# Patient Record
Sex: Female | Born: 1995
Health system: Southern US, Community
[De-identification: ages and names within clinical notes are randomized; demographics above are authoritative.]

## PROBLEM LIST (undated history)

## (undated) DIAGNOSIS — I350 Nonrheumatic aortic (valve) stenosis: Secondary | ICD-10-CM

## (undated) HISTORY — PX: CARDIAC SURGERY: SHX584

---
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
## 2017-09-13 ENCOUNTER — Other Ambulatory Visit: Payer: Self-pay

## 2017-09-13 ENCOUNTER — Emergency Department (HOSPITAL_BASED_OUTPATIENT_CLINIC_OR_DEPARTMENT_OTHER)
Admission: EM | Admit: 2017-09-13 | Discharge: 2017-09-13 | Disposition: A | Discharge status: Home / Self Care | Payer: Medicaid Other | Attending: Emergency Medicine | Admitting: Emergency Medicine

## 2017-09-13 ENCOUNTER — Emergency Department (HOSPITAL_BASED_OUTPATIENT_CLINIC_OR_DEPARTMENT_OTHER): Payer: Medicaid Other

## 2017-09-13 ENCOUNTER — Encounter (HOSPITAL_BASED_OUTPATIENT_CLINIC_OR_DEPARTMENT_OTHER): Payer: Self-pay

## 2017-09-13 DIAGNOSIS — Z87891 Personal history of nicotine dependence: Secondary | ICD-10-CM | POA: Insufficient documentation

## 2017-09-13 DIAGNOSIS — R05 Cough: Secondary | ICD-10-CM | POA: Insufficient documentation

## 2017-09-13 DIAGNOSIS — M791 Myalgia, unspecified site: Secondary | ICD-10-CM | POA: Insufficient documentation

## 2017-09-13 DIAGNOSIS — R6889 Other general symptoms and signs: Secondary | ICD-10-CM

## 2017-09-13 DIAGNOSIS — R509 Fever, unspecified: Secondary | ICD-10-CM | POA: Insufficient documentation

## 2017-09-13 MED ORDER — ACETAMINOPHEN 325 MG PO TABS
650.0000 mg | ORAL_TABLET | Freq: Once | ORAL | Status: AC
  Administered 2017-09-13: 650 mg via ORAL

## 2017-09-13 MED ORDER — ACETAMINOPHEN 500 MG PO TABS
1000.0000 mg | ORAL_TABLET | Freq: Once | ORAL | Status: AC
  Administered 2017-09-13: 1000 mg via ORAL
  Filled 2017-09-13: qty 2

## 2017-09-13 MED ORDER — OSELTAMIVIR PHOSPHATE 75 MG PO CAPS: 75.0000 mg | ORAL_CAPSULE | Freq: Two times a day (BID) | ORAL | 0 refills | Status: DC

## 2017-09-13 MED FILL — TAMIFLU 75 MG GELCAP: 75 | 5 days supply | Qty: 10 | Fill #0

## 2017-09-13 NOTE — ED Triage Notes (Signed)
C/o flu like sx day 2-NAD-steady gait 

## 2017-09-13 NOTE — ED Provider Notes (Signed)
[facility] EMERGENCY DEPARTMENT Provider Note   CSN: [id number] Arrival date & time: 09/13/17  1127     History   Chief Complaint Chief Complaint  Patient presents with  . Cough    HPI Melika Reder is a 22 y.o. female who presents with flulike symptoms.  Onset of symptoms 2 days ago.  She complains of myalgias, cough, URI symptoms, she has no known contacts with similar symptoms.  She is a fever today.  Patient states that she has not tried any medications prior to arrival.  She denies any wheezing, chest pain, shortness of breath.  HPI  History reviewed. No pertinent past medical history.  There are no active problems to display for this patient.   Past Surgical History:  Procedure Laterality Date  . CARDIAC SURGERY      OB History    No data available       Home Medications    Prior to Admission medications   Not on File    Family History No family history on file.  Social History Social History   Tobacco Use  . Smoking status: Former Games developer  . Smokeless tobacco: Never Used  Substance Use Topics  . Alcohol use: No  . Drug use: No     Allergies   Patient has no known allergies.   Review of Systems Review of Systems  Ten systems reviewed and are negative for acute change, except as noted in the HPI.   Physical Exam Updated Vital Signs BP 107/73 (BP Location: Right Arm)   Pulse (!) 118   Temp 100.2 F (37.9 C) (Oral)   Resp 18   Ht 5' (1.524 m)   Wt 61.7 kg (136 lb)   LMP 09/04/2017   SpO2 100%   BMI 26.56 kg/m   Physical Exam  Constitutional: She is oriented to person, place, and time. She appears well-developed and well-nourished. No distress.  HENT:  Head: Normocephalic and atraumatic.  Eyes: Conjunctivae are normal. No scleral icterus.  Neck: Normal range of motion.  Cardiovascular: Normal rate, regular rhythm and normal heart sounds. Exam reveals no gallop and no friction rub.  No murmur heard. Pulmonary/Chest:  Effort normal and breath sounds normal. No respiratory distress.  Abdominal: Soft. Bowel sounds are normal. She exhibits no distension and no mass. There is no tenderness. There is no guarding.  Neurological: She is alert and oriented to person, place, and time.  Skin: Skin is warm and dry. She is not diaphoretic.  Psychiatric: Her behavior is normal.  Nursing note and vitals reviewed.    ED Treatments / Results  Labs (all labs ordered are listed, but only abnormal results are displayed) Labs Reviewed - No data to display  EKG  EKG Interpretation None       Radiology Dg Chest 2 View  Result Date: 09/13/2017 CLINICAL DATA:  Cough, shortness of breath, and fever. EXAM: CHEST  2 VIEW COMPARISON:  None. FINDINGS: The heart size and mediastinal contours are within normal limits. Both lungs are clear. The visualized skeletal structures are unremarkable. IMPRESSION: Normal chest x-ray. Electronically Signed   By: Obie Dredge M.D.   On: 09/13/2017 13:49    Procedures Procedures (including critical care time)  Medications Ordered in ED Medications  acetaminophen (TYLENOL) tablet 1,000 mg (not administered)  acetaminophen (TYLENOL) tablet 650 mg (650 mg Oral Given 09/13/17 1149)     Initial Impression / Assessment and Plan / ED Course  I have reviewed the triage vital signs  and the nursing notes.  Pertinent labs & imaging results that were available during my care of the patient were reviewed by me and considered in my medical decision making (see chart for details).     Pt CXR negative for acute infiltrate. Patients symptoms are consistent with URI, likely viral etiology. Discussed that antibiotics are not indicated for viral infections. Pt will be discharged with symptomatic treatment.  Verbalizes understanding and is agreeable with plan. Pt is hemodynamically stable & in NAD prior to dc.   Final Clinical Impressions(s) / ED Diagnoses   Final diagnoses:  Flu-like symptoms      ED Discharge Orders    None       Arthor CaptainHarris, Ilyaas Musto, PA-C 09/13/17 1535    Doug SouJacubowitz, Sam, MD 09/13/17 1555

## 2017-09-13 NOTE — Discharge Instructions (Signed)
You appear to have an upper respiratory infection (URI). An upper respiratory tract infection, or cold, is a viral infection of the air passages leading to the lungs. It is contagious and can be spread to others, especially during the first 3 or 4 days. It cannot be cured by antibiotics or other medicines. °RETURN IMMEDIATELY IF you develop shortness of breath, confusion or altered mental status, a new rash, become dizzy, faint, or poorly responsive, or are unable to be cared for at home. ° °

## 2017-12-28 ENCOUNTER — Emergency Department (HOSPITAL_BASED_OUTPATIENT_CLINIC_OR_DEPARTMENT_OTHER)
Admission: EM | Admit: 2017-12-28 | Discharge: 2017-12-28 | Disposition: A | Discharge status: Home / Self Care | Payer: Medicaid Other | Attending: Emergency Medicine | Admitting: Emergency Medicine

## 2017-12-28 ENCOUNTER — Other Ambulatory Visit: Payer: Self-pay

## 2017-12-28 ENCOUNTER — Encounter (HOSPITAL_BASED_OUTPATIENT_CLINIC_OR_DEPARTMENT_OTHER): Payer: Self-pay

## 2017-12-28 DIAGNOSIS — B3731 Acute candidiasis of vulva and vagina: Secondary | ICD-10-CM

## 2017-12-28 DIAGNOSIS — Z87891 Personal history of nicotine dependence: Secondary | ICD-10-CM | POA: Insufficient documentation

## 2017-12-28 DIAGNOSIS — B373 Candidiasis of vulva and vagina: Secondary | ICD-10-CM | POA: Diagnosis not present

## 2017-12-28 DIAGNOSIS — N898 Other specified noninflammatory disorders of vagina: Secondary | ICD-10-CM | POA: Diagnosis present

## 2017-12-28 LAB — URINALYSIS, ROUTINE W REFLEX MICROSCOPIC
Bilirubin Urine: NEGATIVE
Glucose, UA: NEGATIVE mg/dL
Ketones, ur: NEGATIVE mg/dL
LEUKOCYTES UA: NEGATIVE
Nitrite: NEGATIVE
Protein, ur: NEGATIVE mg/dL
Specific Gravity, Urine: 1.025 (ref 1.005–1.030)
pH: 6.5 (ref 5.0–8.0)

## 2017-12-28 LAB — WET PREP, GENITAL
CLUE CELLS WET PREP: NONE SEEN
Sperm: NONE SEEN
Trich, Wet Prep: NONE SEEN
YEAST WET PREP: NONE SEEN

## 2017-12-28 LAB — URINALYSIS, MICROSCOPIC (REFLEX)

## 2017-12-28 LAB — PREGNANCY, URINE: Preg Test, Ur: NEGATIVE

## 2017-12-28 MED ORDER — FLUCONAZOLE 50 MG PO TABS
150.0000 mg | ORAL_TABLET | Freq: Once | ORAL | Status: AC
  Administered 2017-12-28: 150 mg via ORAL
  Filled 2017-12-28: qty 1

## 2017-12-28 NOTE — ED Provider Notes (Signed)
[facility] Provider Note: Breanna DellJ. Lane Jaquae Rieves, MD, FACEP  CSN: [id number] MRN: [id number] ARRIVAL: 12/28/17 at 0210 ROOM: MH01/MH01   CHIEF COMPLAINT  Groin Swelling   HISTORY OF PRESENT ILLNESS  12/28/17 2:48 AM Breanna Clark is a 22 y.o. female with vulvovaginal irritation.  She [facility] and was diagnosed with a yeast infection.  She was started on a 3 dose course of an unspecified medication.  She took her first dose yesterday.  The next dose is not due until tomorrow.  Despite taking her first dose of her symptoms of worsened and she is now having burning at rest severe enough to keep her from sleeping.  She denies a discharge.  She is currently on her menses.   History reviewed. No pertinent past medical history.  Past Surgical History:  Procedure Laterality Date  . CARDIAC SURGERY      No family history on file.  Social History   Tobacco Use  . Smoking status: Former Games developermoker  . Smokeless tobacco: Never Used  Substance Use Topics  . Alcohol use: No  . Drug use: No    Prior to Admission medications   Medication Sig Start Date End Date Taking? Authorizing Provider  oseltamivir (TAMIFLU) 75 MG capsule Take 1 capsule (75 mg total) by mouth every 12 (twelve) hours. 09/13/17  Yes Arthor CaptainHarris, Abigail, PA-C    Allergies Patient has no known allergies.   REVIEW OF SYSTEMS  Negative except as noted here or in the History of Present Illness.   PHYSICAL EXAMINATION  Initial Vital Signs Blood pressure (!) 131/93, pulse 90, temperature 98.6 F (37 C), temperature source Oral, resp. rate 18, height 5' (1.524 m), weight 63.5 kg (140 lb), last menstrual period 12/28/2017, SpO2 99 %.  Examination General: Well-developed, well-nourished female in no acute distress; appearance consistent with age of record HENT: normocephalic; atraumatic Eyes: pupils equal, round and reactive to light; extraocular muscles intact Neck: supple Heart:  regular rate and rhythm Lungs: clear to auscultation bilaterally Abdomen: soft; nondistended; nontender; bowel sounds present GU: Mild vulvovaginal inflammation without edema; no vaginal discharge; vaginal bleeding; no cervical motion tenderness Extremities: No deformity; full range of motion; pulses normal Neurologic: Awake, alert and oriented; motor function intact in all extremities and symmetric; no facial droop Skin: Warm and dry Psychiatric: Normal mood and affect   RESULTS  Summary of this visit's results, reviewed by myself:   EKG Interpretation  Date/Time:    Ventricular Rate:    PR Interval:    QRS Duration:   QT Interval:    QTC Calculation:   R Axis:     Text Interpretation:        Laboratory Studies: Results for orders placed or performed during the hospital encounter of 12/28/17 (from the past 24 hour(s))  Pregnancy, urine     Status: None   Collection Time: 12/28/17  2:22 AM  Result Value Ref Range   Preg Test, Ur NEGATIVE NEGATIVE  Urinalysis, Routine w reflex microscopic     Status: Abnormal   Collection Time: 12/28/17  2:22 AM  Result Value Ref Range   Color, Urine YELLOW YELLOW   APPearance CLEAR CLEAR   Specific Gravity, Urine 1.025 1.005 - 1.030   pH 6.5 5.0 - 8.0   Glucose, UA NEGATIVE NEGATIVE mg/dL   Hgb urine dipstick LARGE (A) NEGATIVE   Bilirubin Urine NEGATIVE NEGATIVE   Ketones, ur NEGATIVE NEGATIVE mg/dL   Protein, ur NEGATIVE NEGATIVE mg/dL  Nitrite NEGATIVE NEGATIVE   Leukocytes, UA NEGATIVE NEGATIVE  Urinalysis, Microscopic (reflex)     Status: Abnormal   Collection Time: 12/28/17  2:22 AM  Result Value Ref Range   RBC / HPF 0-5 0 - 5 RBC/hpf   WBC, UA 0-5 0 - 5 WBC/hpf   Bacteria, UA RARE (A) NONE SEEN   Squamous Epithelial / LPF 6-10 0 - 5  Wet prep, genital     Status: Abnormal   Collection Time: 12/28/17  3:02 AM  Result Value Ref Range   Yeast Wet Prep HPF POC NONE SEEN NONE SEEN   Trich, Wet Prep NONE SEEN NONE SEEN    Clue Cells Wet Prep HPF POC NONE SEEN NONE SEEN   WBC, Wet Prep HPF POC MODERATE (A) NONE SEEN   Sperm NONE SEEN    Imaging Studies: No results found.  ED COURSE and MDM  Nursing notes and initial vitals signs, including pulse oximetry, reviewed.  Vitals:   12/28/17 0217 12/28/17 0219  BP:  (!) 131/93  Pulse:  90  Resp:  18  Temp:  98.6 F (37 C)  TempSrc:  Oral  SpO2:  99%  Weight: 63.5 kg (140 lb)   Height: 5' (1.524 m)    We will treat with an additional dose of Diflucan.  Will avoid topical Monistat at this time due to her menses but she was advised that she may wish to try this if symptoms persist after the end of her period.  PROCEDURES    ED DIAGNOSES     ICD-10-CM   1. Vulvovaginitis due to yeast B37.3        Marshawn Normoyle, MD 12/28/17 [id number]

## 2017-12-28 NOTE — ED Triage Notes (Signed)
Pt seen at health dept on Monday dx with yeast infection. Still on antibiotic but feels it is getting worse. Pt also reports swelling.

## 2017-12-29 LAB — GC/CHLAMYDIA PROBE AMP (~~LOC~~) NOT AT ARMC
Chlamydia: NEGATIVE
NEISSERIA GONORRHEA: NEGATIVE

## 2018-01-28 ENCOUNTER — Other Ambulatory Visit: Payer: Self-pay

## 2018-01-28 ENCOUNTER — Emergency Department (HOSPITAL_BASED_OUTPATIENT_CLINIC_OR_DEPARTMENT_OTHER)
Admission: EM | Admit: 2018-01-28 | Discharge: 2018-01-28 | Disposition: A | Discharge status: Home / Self Care | Payer: Medicaid Other | Attending: Emergency Medicine | Admitting: Emergency Medicine

## 2018-01-28 ENCOUNTER — Encounter (HOSPITAL_BASED_OUTPATIENT_CLINIC_OR_DEPARTMENT_OTHER): Payer: Self-pay | Admitting: Emergency Medicine

## 2018-01-28 DIAGNOSIS — N898 Other specified noninflammatory disorders of vagina: Secondary | ICD-10-CM | POA: Diagnosis not present

## 2018-01-28 DIAGNOSIS — H9209 Otalgia, unspecified ear: Secondary | ICD-10-CM | POA: Diagnosis not present

## 2018-01-28 DIAGNOSIS — Z5321 Procedure and treatment not carried out due to patient leaving prior to being seen by health care provider: Secondary | ICD-10-CM | POA: Diagnosis not present

## 2018-01-28 NOTE — ED Triage Notes (Signed)
Patient states that her ear is has been bothering her for 4 days and she has had some itching to her vaginal area x 3 days

## 2018-01-29 ENCOUNTER — Other Ambulatory Visit: Payer: Self-pay

## 2018-01-29 ENCOUNTER — Encounter (HOSPITAL_BASED_OUTPATIENT_CLINIC_OR_DEPARTMENT_OTHER): Payer: Self-pay | Admitting: Emergency Medicine

## 2018-01-29 ENCOUNTER — Emergency Department (HOSPITAL_BASED_OUTPATIENT_CLINIC_OR_DEPARTMENT_OTHER)
Admission: EM | Admit: 2018-01-29 | Discharge: 2018-01-29 | Disposition: A | Discharge status: Home / Self Care | Payer: Medicaid Other | Attending: Emergency Medicine | Admitting: Emergency Medicine

## 2018-01-29 DIAGNOSIS — Z87891 Personal history of nicotine dependence: Secondary | ICD-10-CM | POA: Insufficient documentation

## 2018-01-29 DIAGNOSIS — Z3A01 Less than 8 weeks gestation of pregnancy: Secondary | ICD-10-CM | POA: Diagnosis not present

## 2018-01-29 DIAGNOSIS — B9689 Other specified bacterial agents as the cause of diseases classified elsewhere: Secondary | ICD-10-CM | POA: Diagnosis not present

## 2018-01-29 DIAGNOSIS — N898 Other specified noninflammatory disorders of vagina: Secondary | ICD-10-CM | POA: Diagnosis present

## 2018-01-29 DIAGNOSIS — N760 Acute vaginitis: Secondary | ICD-10-CM | POA: Insufficient documentation

## 2018-01-29 LAB — URINALYSIS, ROUTINE W REFLEX MICROSCOPIC
BILIRUBIN URINE: NEGATIVE
Glucose, UA: NEGATIVE mg/dL
KETONES UR: NEGATIVE mg/dL
Nitrite: NEGATIVE
PROTEIN: NEGATIVE mg/dL
pH: 6 (ref 5.0–8.0)

## 2018-01-29 LAB — WET PREP, GENITAL
SPERM: NONE SEEN
Trich, Wet Prep: NONE SEEN

## 2018-01-29 LAB — URINALYSIS, MICROSCOPIC (REFLEX)

## 2018-01-29 LAB — PREGNANCY, URINE: Preg Test, Ur: POSITIVE — AB

## 2018-01-29 MED ORDER — LIDOCAINE HCL (PF) 1 % IJ SOLN
INTRAMUSCULAR | Status: AC
  Administered 2018-01-29: 2.3 mL
  Filled 2018-01-29: qty 5

## 2018-01-29 MED ORDER — CLOTRIMAZOLE 1 % VA CREA: 1.0000 | TOPICAL_CREAM | Freq: Every day | VAGINAL | 0 refills | Status: DC

## 2018-01-29 MED ORDER — CEFTRIAXONE SODIUM 250 MG IJ SOLR
250.0000 mg | Freq: Once | INTRAMUSCULAR | Status: AC
  Administered 2018-01-29: 250 mg via INTRAMUSCULAR
  Filled 2018-01-29: qty 250

## 2018-01-29 MED ORDER — METRONIDAZOLE 500 MG PO TABS: 500.0000 mg | ORAL_TABLET | Freq: Two times a day (BID) | ORAL | 0 refills | Status: DC

## 2018-01-29 MED ORDER — AZITHROMYCIN 250 MG PO TABS
1000.0000 mg | ORAL_TABLET | Freq: Once | ORAL | Status: AC
  Administered 2018-01-29: 1000 mg via ORAL
  Filled 2018-01-29: qty 4

## 2018-01-29 NOTE — ED Notes (Signed)
Pelvic cart at room 

## 2018-01-29 NOTE — ED Triage Notes (Signed)
Left ear pain since last Wednesday, some sore throat and nasal drainage.  Vaginal discharge with burning and itching since Friday.

## 2018-01-29 NOTE — Discharge Instructions (Signed)
You need to establish care as soon as possible with an OB/GYN.  You should start taking a prenatal vitamin which she can obtain over-the-counter.  Return to the emergency room for any worsening symptoms.

## 2018-01-29 NOTE — ED Provider Notes (Signed)
[facility] EMERGENCY DEPARTMENT Provider Note   CSN: [id number] Arrival date & time: 01/29/18  0734     History   Chief Complaint Chief Complaint  Patient presents with  . Otalgia  . Vaginal Discharge    HPI Breanna Clark is a 22 y.o. female.  Patient is a 22 year old female who has 2 complaints.  She does have some ear congestion.  She has had URI symptoms for about 2 weeks.  She still has a little bit of a runny nose.  No fevers.  No significant headaches.  She feels like her ears are sore and congested.  No dizziness.  She was taking some Mucinex but she did not feel like it was really helping so she has not been taking anything recently.  She also complains of vaginal itching and discharge.  This is been going on for about 2 days.  No abdominal pain.  No nausea or vomiting.  No fevers.  No urinary symptoms.  She does have a prior history of gonorrhea and chlamydia.  She is sexually active.  Last menstrual period was April 30.     No past medical history on file.  There are no active problems to display for this patient.   Past Surgical History:  Procedure Laterality Date  . CARDIAC SURGERY       OB History   None      Home Medications    Prior to Admission medications   Medication Sig Start Date End Date Taking? Authorizing Provider  clotrimazole (GYNE-LOTRIMIN) 1 % vaginal cream Place 1 Applicatorful vaginally at bedtime. For 7 days 01/29/18   Rolan Bucco, MD  metroNIDAZOLE (FLAGYL) 500 MG tablet Take 1 tablet (500 mg total) by mouth 2 (two) times daily. One po bid x 7 days 01/29/18   Rolan Bucco, MD    Family History No family history on file.  Social History Social History   Tobacco Use  . Smoking status: Former Games developer  . Smokeless tobacco: Never Used  Substance Use Topics  . Alcohol use: No  . Drug use: No     Allergies   Patient has no known allergies.   Review of Systems Review of Systems  Constitutional: Negative for  chills, diaphoresis, fatigue and fever.  HENT: Positive for congestion, ear pain and rhinorrhea. Negative for sneezing.   Eyes: Negative.   Respiratory: Negative for cough, chest tightness and shortness of breath.   Cardiovascular: Negative for chest pain and leg swelling.  Gastrointestinal: Negative for abdominal pain, blood in stool, diarrhea, nausea and vomiting.  Genitourinary: Positive for vaginal discharge. Negative for difficulty urinating, flank pain, frequency and hematuria.  Musculoskeletal: Negative for arthralgias and back pain.  Skin: Negative for rash.  Neurological: Negative for dizziness, speech difficulty, weakness, numbness and headaches.     Physical Exam Updated Vital Signs BP 121/85   Pulse 100   Temp 99.3 F (37.4 C) (Oral)   Resp 16   Ht 5' (1.524 m)   Wt 63.5 kg (140 lb)   LMP 01/02/2018   SpO2 100%   BMI 27.34 kg/m   Physical Exam  Genitourinary:  Genitourinary Comments: Large amount of thick yellow discharge.  No cervical motion tenderness or adnexal tenderness, no bleeding     ED Treatments / Results  Labs (all labs ordered are listed, but only abnormal results are displayed) Labs Reviewed  WET PREP, GENITAL - Abnormal; Notable for the following components:      Result Value   Yeast  Wet Prep HPF POC PRESENT (*)    Clue Cells Wet Prep HPF POC PRESENT (*)    WBC, Wet Prep HPF POC MANY (*)    All other components within normal limits  URINALYSIS, ROUTINE W REFLEX MICROSCOPIC - Abnormal; Notable for the following components:   APPearance CLOUDY (*)    Specific Gravity, Urine >1.030 (*)    Hgb urine dipstick TRACE (*)    Leukocytes, UA MODERATE (*)    All other components within normal limits  PREGNANCY, URINE - Abnormal; Notable for the following components:   Preg Test, Ur POSITIVE (*)    All other components within normal limits  URINALYSIS, MICROSCOPIC (REFLEX) - Abnormal; Notable for the following components:   Bacteria, UA MANY (*)     All other components within normal limits  RPR  HIV ANTIBODY (ROUTINE TESTING)  GC/CHLAMYDIA PROBE AMP ([place]) NOT AT [facility]    EKG None  Radiology No results found.  Procedures Procedures (including critical care time)  Medications Ordered in ED Medications  cefTRIAXone (ROCEPHIN) injection 250 mg (250 mg Intramuscular Given 01/29/18 0822)  azithromycin (ZITHROMAX) tablet 1,000 mg (1,000 mg Oral Given 01/29/18 0820)  lidocaine (PF) (XYLOCAINE) 1 % injection (2.3 mLs  Given 01/29/18 1610)     Initial Impression / Assessment and Plan / ED Course  I have reviewed the triage vital signs and the nursing notes.  Pertinent labs & imaging results that were available during my care of the patient were reviewed by me and considered in my medical decision making (see chart for details).     Patient is a 22 year old female who presents with vaginal discharge and ear pain.  Her ears do not appear infected.  She has clear fluid behind both TMs.  I recommended using plain Zyrtec for symptomatic relief.  She also has vaginal discharge.  She has a prior history of STDs.  She has large amount of yellow discharge.  Her pregnancy test is positive.  Given her high risk status, I did go ahead and treat her presumptively for GC and chlamydia with Rocephin and Zithromax.  She also tested positive for yeast and BV.  She was given a prescription for Chlortrimazole suppositories as well as Flagyl for the bacterial vaginosis.  She was encouraged to start taking a prenatal vitamin which she can obtain over-the-counter.  She also was encouraged to establish care with an OB/GYN to get early prenatal care.  Return precautions were given.  Final Clinical Impressions(s) / ED Diagnoses   Final diagnoses:  Less than [redacted] weeks gestation of pregnancy  BV (bacterial vaginosis)  Acute vaginitis    ED Discharge Orders        Ordered    clotrimazole (GYNE-LOTRIMIN) 1 % vaginal cream  Daily at bedtime     01/29/18  0836    metroNIDAZOLE (FLAGYL) 500 MG tablet  2 times daily     01/29/18 0836       Rolan Bucco, MD 01/29/18 [phone]

## 2018-01-30 LAB — RPR: RPR: NONREACTIVE

## 2018-01-30 LAB — GC/CHLAMYDIA PROBE AMP (~~LOC~~) NOT AT ARMC
CHLAMYDIA, DNA PROBE: NEGATIVE
Neisseria Gonorrhea: NEGATIVE

## 2018-01-31 LAB — HIV ANTIBODY (ROUTINE TESTING W REFLEX): HIV SCREEN 4TH GENERATION: NONREACTIVE

## 2018-03-11 ENCOUNTER — Encounter (HOSPITAL_BASED_OUTPATIENT_CLINIC_OR_DEPARTMENT_OTHER): Payer: Self-pay | Admitting: Emergency Medicine

## 2018-03-11 ENCOUNTER — Other Ambulatory Visit: Payer: Self-pay

## 2018-03-11 ENCOUNTER — Other Ambulatory Visit: Payer: Self-pay | Admitting: Emergency Medicine

## 2018-03-11 ENCOUNTER — Emergency Department (HOSPITAL_BASED_OUTPATIENT_CLINIC_OR_DEPARTMENT_OTHER)
Admission: EM | Admit: 2018-03-11 | Discharge: 2018-03-11 | Disposition: A | Discharge status: Home / Self Care | Payer: Medicaid Other | Attending: Emergency Medicine | Admitting: Emergency Medicine

## 2018-03-11 DIAGNOSIS — Z87891 Personal history of nicotine dependence: Secondary | ICD-10-CM | POA: Insufficient documentation

## 2018-03-11 DIAGNOSIS — N3001 Acute cystitis with hematuria: Secondary | ICD-10-CM | POA: Diagnosis not present

## 2018-03-11 DIAGNOSIS — N898 Other specified noninflammatory disorders of vagina: Secondary | ICD-10-CM | POA: Insufficient documentation

## 2018-03-11 LAB — PREGNANCY, URINE: Preg Test, Ur: POSITIVE — AB

## 2018-03-11 LAB — URINALYSIS, ROUTINE W REFLEX MICROSCOPIC
BILIRUBIN URINE: NEGATIVE
Glucose, UA: NEGATIVE mg/dL
KETONES UR: NEGATIVE mg/dL
NITRITE: NEGATIVE
PH: 5.5 (ref 5.0–8.0)
Protein, ur: NEGATIVE mg/dL
Specific Gravity, Urine: >1.03 — ABNORMAL HIGH (ref 1.005–1.030)

## 2018-03-11 LAB — WET PREP, GENITAL
Clue Cells Wet Prep HPF POC: NONE SEEN
Sperm: NONE SEEN
TRICH WET PREP: NONE SEEN
Yeast Wet Prep HPF POC: NONE SEEN

## 2018-03-11 MED ORDER — CEPHALEXIN 500 MG PO CAPS: 500.0000 mg | ORAL_CAPSULE | Freq: Two times a day (BID) | ORAL | 0 refills | Status: DC

## 2018-03-11 NOTE — ED Notes (Signed)
Pt states she is no longer pregnant. Pt had an abortion procedure on 7/1.

## 2018-03-11 NOTE — ED Notes (Signed)
Pt given d/c instructions as per chart. rx x 1. Verbalizes understanding. No questions. 

## 2018-03-11 NOTE — ED Provider Notes (Signed)
[facility] EMERGENCY DEPARTMENT Provider Note   CSN: [id number] Arrival date & time: 03/11/18  1451     History   Chief Complaint Chief Complaint  Patient presents with  . Vaginal Discharge    HPI Breanna Clark is a 22 y.o. female who presents emergency department today for vaginal itching/discharge.  Patient was recently pregnant and reports that she underwent an abortion by procedure on Monday at [facility] choice of [place].  Patient's last menstrual cycle prior to this was April 30.  She has not had any abdominal pain or vaginal bleeding since then. Patient reports that she was seen here on 5/27 for vaginal itching as well as vaginal discharge.  She was diagnosed with bacterial vaginosis and acute vaginitis.  She reports that she took the antibiotics but was not able to fill the cream (clotrimazole).  She has had continued vaginal itching as well as a clear/yellow discharge. She denies urinary symptoms, abdominal pain, syncope or vaginal bleeding. She is sexual active with female partners. She denies fevers, pelvic pain, N/V, flank pain, or urinary symptoms. She does have history of G/C in the past (most recent cultures were negative). Was on prophylactic ceftriaxone and azithromycin during last visit.  She has not been sexually active since that time.  HPI  History reviewed. No pertinent past medical history.  There are no active problems to display for this patient.   Past Surgical History:  Procedure Laterality Date  . CARDIAC SURGERY       OB History   None      Home Medications    Prior to Admission medications   Medication Sig Start Date End Date Taking? Authorizing Provider  clotrimazole (GYNE-LOTRIMIN) 1 % vaginal cream Place 1 Applicatorful vaginally at bedtime. For 7 days 01/29/18   Rolan Bucco, MD  metroNIDAZOLE (FLAGYL) 500 MG tablet Take 1 tablet (500 mg total) by mouth 2 (two) times daily. One po bid x 7 days 01/29/18   Rolan Bucco, MD     Family History History reviewed. No pertinent family history.  Social History Social History   Tobacco Use  . Smoking status: Former Games developer  . Smokeless tobacco: Never Used  Substance Use Topics  . Alcohol use: No  . Drug use: No     Allergies   Patient has no known allergies.   Review of Systems Review of Systems  All other systems reviewed and are negative.    Physical Exam Updated Vital Signs BP 120/75 (BP Location: Left Arm)   Pulse 92   Temp 98.5 F (36.9 C) (Oral)   Resp 18   Ht 5' (1.524 m)   Wt 68 kg (150 lb)   LMP 03/05/2018   SpO2 100%   BMI 29.29 kg/m   Physical Exam  Constitutional: She appears well-developed and well-nourished.  HENT:  Head: Normocephalic and atraumatic.  Right Ear: External ear normal.  Left Ear: External ear normal.  Nose: Nose normal.  Mouth/Throat: Uvula is midline, oropharynx is clear and moist and mucous membranes are normal. No tonsillar exudate.  Eyes: Pupils are equal, round, and reactive to light. Right eye exhibits no discharge. Left eye exhibits no discharge. No scleral icterus.  Neck: Trachea normal. Neck supple. No spinous process tenderness present. No neck rigidity. Normal range of motion present.  Cardiovascular: Normal rate, regular rhythm and intact distal pulses.  No murmur heard. Pulses:      Radial pulses are 2+ on the right side, and 2+ on the left side.  Dorsalis pedis pulses are 2+ on the right side, and 2+ on the left side.       Posterior tibial pulses are 2+ on the right side, and 2+ on the left side.  No lower extremity swelling or edema. Calves symmetric in size bilaterally.  Pulmonary/Chest: Effort normal and breath sounds normal. She exhibits no tenderness.  Abdominal: Soft. Bowel sounds are normal. She exhibits no distension. There is no tenderness. There is no rigidity, no rebound, no guarding and no CVA tenderness.  Genitourinary:  Genitourinary Comments: Exam performed by Jacinto HalimMichael M  Clatie Kessen, exam chaperoned Pelvic exam: normal external genitalia without evidence of trauma. VULVA: normal appearing vulva with no masses, tenderness or lesion. VAGINA: normal appearing vagina with normal color and discharge, no lesions. CERVIX: normal appearing cervix without lesions, cervical motion tenderness absent, cervical os closed with out purulent discharge; vaginal discharge - white and scant, Wet prep and DNA probe for chlamydia and GC obtained.   ADNEXA: normal adnexa in size, nontender and no masses UTERUS: uterus is normal size, shape, consistency and nontender.   Musculoskeletal: She exhibits no edema.  Lymphadenopathy:    She has no cervical adenopathy.  Neurological: She is alert.  Skin: Skin is warm and dry. No rash noted. She is not diaphoretic.  Psychiatric: She has a normal mood and affect.  Nursing note and vitals reviewed.    ED Treatments / Results  Labs (all labs ordered are listed, but only abnormal results are displayed) Labs Reviewed  WET PREP, GENITAL - Abnormal; Notable for the following components:      Result Value   WBC, Wet Prep HPF POC MANY (*)    All other components within normal limits  PREGNANCY, URINE - Abnormal; Notable for the following components:   Preg Test, Ur POSITIVE (*)    All other components within normal limits  URINALYSIS, ROUTINE W REFLEX MICROSCOPIC - Abnormal; Notable for the following components:   APPearance CLOUDY (*)    Specific Gravity, Urine >1.030 (*)    Hgb urine dipstick SMALL (*)    Leukocytes, UA MODERATE (*)    All other components within normal limits  URINE CULTURE  RPR  HIV ANTIBODY (ROUTINE TESTING)  GC/CHLAMYDIA PROBE AMP ([place]) NOT AT [facility]    EKG None  Radiology No results found.  Procedures Procedures (including critical care time)  Medications Ordered in ED Medications - No data to display   Initial Impression / Assessment and Plan / ED Course  I have reviewed the triage vital signs  and the nursing notes.  Pertinent labs & imaging results that were available during my care of the patient were reviewed by me and considered in my medical decision making (see chart for details).     22 y.o. female who [facility] [place] presenting with vaginal discharge and vaginal irritation.  Vital signs are reassuring on presentation.  Patient without any abdominal pain or vaginal bleeding.  Exam is not consistent with PID.  Patient's last gonorrhea and Chlamydia cultures were negative  Patient's urine pregnancy test is positive as expected.  There is no evidence of yeast infection, trichomonas or BV.  Gonorrhea/Chlamydia cultures are pending.  Patient does have moderate leukocytes.  Likely secondary to UTI as patient gonorrhea and Chlamydia were negative previously and she is not been sexually active since that time.  Patient without history of kidney stones.  No CVA tenderness, nausea, vomiting or fever to  make me concern for pyelonephritis..  Will treat with Keflex and send urine culture. I advised the patient to follow-up with PCP this week. Specific return precautions discussed. Time was given for all questions to be answered. The patient verbalized understanding and agreement with plan. The patient appears safe for discharge home.  Final Clinical Impressions(s) / ED Diagnoses   Final diagnoses:  Acute cystitis with hematuria    ED Discharge Orders        Ordered    cephALEXin (KEFLEX) 500 MG capsule  2 times daily     03/11/18 1936       Princella Pellegrini 03/11/18 2020    Arby Barrette, MD 03/13/18 1150

## 2018-03-11 NOTE — ED Triage Notes (Signed)
Patient states that she was here about a month ago for BV - she took the medications and states that she continues to have the d/c

## 2018-03-12 LAB — GC/CHLAMYDIA PROBE AMP (~~LOC~~) NOT AT ARMC
Chlamydia: NEGATIVE
NEISSERIA GONORRHEA: NEGATIVE

## 2018-03-13 LAB — URINE CULTURE

## 2018-03-13 LAB — HIV ANTIBODY (ROUTINE TESTING W REFLEX): HIV SCREEN 4TH GENERATION: NONREACTIVE

## 2018-03-13 LAB — RPR: RPR: NONREACTIVE

## 2018-04-04 ENCOUNTER — Encounter (HOSPITAL_BASED_OUTPATIENT_CLINIC_OR_DEPARTMENT_OTHER): Payer: Self-pay | Admitting: Emergency Medicine

## 2018-04-04 ENCOUNTER — Other Ambulatory Visit: Payer: Self-pay

## 2018-04-04 ENCOUNTER — Emergency Department (HOSPITAL_BASED_OUTPATIENT_CLINIC_OR_DEPARTMENT_OTHER)
Admission: EM | Admit: 2018-04-04 | Discharge: 2018-04-04 | Disposition: A | Discharge status: Home / Self Care | Payer: Medicaid Other | Attending: Emergency Medicine | Admitting: Emergency Medicine

## 2018-04-04 DIAGNOSIS — R21 Rash and other nonspecific skin eruption: Secondary | ICD-10-CM | POA: Diagnosis not present

## 2018-04-04 DIAGNOSIS — Z87891 Personal history of nicotine dependence: Secondary | ICD-10-CM | POA: Diagnosis not present

## 2018-04-04 NOTE — ED Triage Notes (Signed)
Reports rash to bilateral arms and legs x 1 week.  Reports this as pruritic.

## 2018-04-04 NOTE — Discharge Instructions (Addendum)
If your rash worsens or he develop fever, streaking redness, pain, trouble breathing or swallowing, or any other new/concerning symptoms and return to the ER for evaluation.

## 2018-04-04 NOTE — ED Provider Notes (Signed)
[facility] EMERGENCY DEPARTMENT Provider Note   CSN: [id number] Arrival date & time: 04/04/18  1045     History   Chief Complaint Chief Complaint  Patient presents with  . Rash    HPI Breanna Clark is a 22 y.o. female.  HPI  22 year old female presents with rash.  Started 1 week ago.  Started as a small spot on 1 of her arms and now has progressed.  They are itchy.  They are not rapidly progressing.  No new detergents, medicines, animals, food, etc.  No trouble breathing or swallowing or any facial swelling.  Has tried Benadryl cream and hydrocortisone cream.  History reviewed. No pertinent past medical history.  There are no active problems to display for this patient.   Past Surgical History:  Procedure Laterality Date  . CARDIAC SURGERY       OB History   None      Home Medications    Prior to Admission medications   Medication Sig Start Date End Date Taking? Authorizing Provider  cephALEXin (KEFLEX) 500 MG capsule Take 1 capsule (500 mg total) by mouth 2 (two) times daily. 03/11/18   Maczis, Elmer Sow, PA-C  clotrimazole (GYNE-LOTRIMIN) 1 % vaginal cream Place 1 Applicatorful vaginally at bedtime. For 7 days 01/29/18   Rolan Bucco, MD  metroNIDAZOLE (FLAGYL) 500 MG tablet Take 1 tablet (500 mg total) by mouth 2 (two) times daily. One po bid x 7 days 01/29/18   Rolan Bucco, MD    Family History History reviewed. No pertinent family history.  Social History Social History   Tobacco Use  . Smoking status: Former Games developer  . Smokeless tobacco: Never Used  Substance Use Topics  . Alcohol use: No  . Drug use: No     Allergies   Patient has no known allergies.   Review of Systems Review of Systems  Constitutional: Negative for fever.  Respiratory: Negative for shortness of breath.   Skin: Positive for rash.     Physical Exam Updated Vital Signs BP 122/82 (BP Location: Left Arm)   Pulse 80   Temp 98.8 F (37.1 C) (Oral)   Resp  18   Ht 5' (1.524 m)   Wt 63.5 kg (140 lb)   LMP 03/05/2018   SpO2 100%   BMI 27.34 kg/m   Physical Exam  Constitutional: She is oriented to person, place, and time. She appears well-developed and well-nourished.  HENT:  Head: Normocephalic and atraumatic.  Right Ear: External ear normal.  Left Ear: External ear normal.  Nose: Nose normal.  Mouth/Throat: Oropharynx is clear and moist. No oropharyngeal exudate.  Eyes: Right eye exhibits no discharge. Left eye exhibits no discharge.  Neck: Neck supple.  Cardiovascular: Normal rate, regular rhythm and normal heart sounds.  Pulmonary/Chest: Effort normal and breath sounds normal. No stridor. She has no wheezes.  Abdominal: She exhibits no distension.  Neurological: She is alert and oriented to person, place, and time.  Skin: Skin is warm and dry. Rash noted.  Patient has infrequent small papules, mostly to the bilateral arms.  They are about 3 on each arm.  A couple have a red base but there are no vesicular lesions.  There is no streaking or cellulitis.  There is one lesion to her right flank and a couple on her right thigh.  Nursing note and vitals reviewed.    ED Treatments / Results  Labs (all labs ordered are listed, but only abnormal results are displayed) Labs Reviewed -  No data to display  EKG None  Radiology No results found.  Procedures Procedures (including critical care time)  Medications Ordered in ED Medications - No data to display   Initial Impression / Assessment and Plan / ED Course  I have reviewed the triage vital signs and the nursing notes.  Pertinent labs & imaging results that were available during my care of the patient were reviewed by me and considered in my medical decision making (see chart for details).     Unclear what exactly is causing her papular rash but this does not appear emergent or critical at this time.  There is no fevers or oral lesions.  Does not appear like a drug rash.  At  this point I think she can use oral Benadryl in addition to her symptoms and follow-up with PCP and/or dermatologist.  Final Clinical Impressions(s) / ED Diagnoses   Final diagnoses:  Rash and nonspecific skin eruption    ED Discharge Orders    None       Pricilla LovelessGoldston, Patton Rabinovich, MD 04/04/18 1219

## 2018-07-30 ENCOUNTER — Emergency Department (HOSPITAL_BASED_OUTPATIENT_CLINIC_OR_DEPARTMENT_OTHER)
Admission: EM | Admit: 2018-07-30 | Discharge: 2018-07-30 | Disposition: A | Discharge status: Home / Self Care | Payer: Medicaid Other | Attending: Emergency Medicine | Admitting: Emergency Medicine

## 2018-07-30 ENCOUNTER — Encounter (HOSPITAL_BASED_OUTPATIENT_CLINIC_OR_DEPARTMENT_OTHER): Payer: Self-pay | Admitting: *Deleted

## 2018-07-30 ENCOUNTER — Other Ambulatory Visit: Payer: Self-pay

## 2018-07-30 DIAGNOSIS — Z87891 Personal history of nicotine dependence: Secondary | ICD-10-CM | POA: Diagnosis not present

## 2018-07-30 DIAGNOSIS — N72 Inflammatory disease of cervix uteri: Secondary | ICD-10-CM | POA: Insufficient documentation

## 2018-07-30 DIAGNOSIS — N898 Other specified noninflammatory disorders of vagina: Secondary | ICD-10-CM | POA: Diagnosis present

## 2018-07-30 LAB — WET PREP, GENITAL
CLUE CELLS WET PREP: NONE SEEN
Sperm: NONE SEEN
Trich, Wet Prep: NONE SEEN
Yeast Wet Prep HPF POC: NONE SEEN

## 2018-07-30 LAB — URINALYSIS, ROUTINE W REFLEX MICROSCOPIC
Bilirubin Urine: NEGATIVE
GLUCOSE, UA: NEGATIVE mg/dL
HGB URINE DIPSTICK: NEGATIVE
Ketones, ur: NEGATIVE mg/dL
Leukocytes, UA: NEGATIVE
Nitrite: NEGATIVE
Protein, ur: NEGATIVE mg/dL
Specific Gravity, Urine: 1.025 (ref 1.005–1.030)
pH: 6.5 (ref 5.0–8.0)

## 2018-07-30 LAB — PREGNANCY, URINE: PREG TEST UR: NEGATIVE

## 2018-07-30 MED ORDER — AZITHROMYCIN 250 MG PO TABS
500.0000 mg | ORAL_TABLET | Freq: Once | ORAL | Status: AC
  Administered 2018-07-30: 500 mg via ORAL
  Filled 2018-07-30: qty 2

## 2018-07-30 MED ORDER — LIDOCAINE HCL (PF) 1 % IJ SOLN
2.0000 mL | Freq: Once | INTRAMUSCULAR | Status: AC
  Administered 2018-07-30: 2 mL via INTRADERMAL
  Filled 2018-07-30: qty 5

## 2018-07-30 MED ORDER — CEFTRIAXONE SODIUM 250 MG IJ SOLR
250.0000 mg | Freq: Once | INTRAMUSCULAR | Status: AC
  Administered 2018-07-30: 250 mg via INTRAMUSCULAR
  Filled 2018-07-30: qty 250

## 2018-07-30 NOTE — ED Triage Notes (Signed)
Pt has been having vaginal discharge and itching for a week.

## 2018-07-30 NOTE — Discharge Instructions (Signed)
Use condoms when having sex. °

## 2018-07-30 NOTE — ED Provider Notes (Signed)
[facility] EMERGENCY DEPARTMENT Provider Note   CSN: [id number] Arrival date & time: 07/30/18  7829     History   Chief Complaint Chief Complaint  Patient presents with  . Vaginal Itching    HPI Breanna Clark is a 22 y.o. female.  Pt presents to the ED today with vaginal discharge and itching.  She has noticed sx for the past week.  She has had unprotected sex.  She does not know if she's been exposed to STI or not.  No other sx.     History reviewed. No pertinent past medical history.  There are no active problems to display for this patient.   Past Surgical History:  Procedure Laterality Date  . CARDIAC SURGERY       OB History   None      Home Medications    Prior to Admission medications   Medication Sig Start Date End Date Taking? Authorizing Provider  cephALEXin (KEFLEX) 500 MG capsule Take 1 capsule (500 mg total) by mouth 2 (two) times daily. 03/11/18   Maczis, Elmer Sow, PA-C  clotrimazole (GYNE-LOTRIMIN) 1 % vaginal cream Place 1 Applicatorful vaginally at bedtime. For 7 days 01/29/18   Rolan Bucco, MD  metroNIDAZOLE (FLAGYL) 500 MG tablet Take 1 tablet (500 mg total) by mouth 2 (two) times daily. One po bid x 7 days 01/29/18   Rolan Bucco, MD    Family History History reviewed. No pertinent family history.  Social History Social History   Tobacco Use  . Smoking status: Former Games developer  . Smokeless tobacco: Never Used  Substance Use Topics  . Alcohol use: No  . Drug use: No     Allergies   Patient has no known allergies.   Review of Systems Review of Systems  Genitourinary: Positive for vaginal discharge.  All other systems reviewed and are negative.    Physical Exam Updated Vital Signs BP 113/71 (BP Location: Left Arm)   Pulse 80   Temp 98.6 F (37 C) (Oral)   Resp 20   Ht 5' (1.524 m)   Wt 61.7 kg   LMP 07/21/2018 (Exact Date)   SpO2 100%   BMI 26.56 kg/m   Physical Exam  Constitutional: She is  oriented to person, place, and time. She appears well-developed and well-nourished.  HENT:  Head: Normocephalic and atraumatic.  Right Ear: External ear normal.  Left Ear: External ear normal.  Nose: Nose normal.  Mouth/Throat: Oropharynx is clear and moist.  Eyes: Pupils are equal, round, and reactive to light. Conjunctivae and EOM are normal.  Neck: Normal range of motion. Neck supple.  Cardiovascular: Normal rate, regular rhythm, normal heart sounds and intact distal pulses.  Pulmonary/Chest: Effort normal and breath sounds normal.  Abdominal: Soft. Bowel sounds are normal.  Genitourinary: Vagina normal. Cervix exhibits discharge. Right adnexum displays no tenderness. Left adnexum displays no tenderness.  Musculoskeletal: Normal range of motion.  Neurological: She is alert and oriented to person, place, and time.  Skin: Skin is warm. Capillary refill takes less than 2 seconds.  Psychiatric: She has a normal mood and affect. Her behavior is normal. Judgment and thought content normal.  Nursing note and vitals reviewed.    ED Treatments / Results  Labs (all labs ordered are listed, but only abnormal results are displayed) Labs Reviewed  WET PREP, GENITAL - Abnormal; Notable for the following components:      Result Value   WBC, Wet Prep HPF POC MODERATE (*)  All other components within normal limits  URINALYSIS, ROUTINE W REFLEX MICROSCOPIC  PREGNANCY, URINE  GC/CHLAMYDIA PROBE AMP ([place]) NOT [facility]    EKG None  Radiology No results found.  Procedures Procedures (including critical care time)  Medications Ordered in ED Medications  cefTRIAXone (ROCEPHIN) injection 250 mg (250 mg Intramuscular Given 07/30/18 1007)  azithromycin (ZITHROMAX) tablet 500 mg (500 mg Oral Given 07/30/18 1007)  lidocaine (PF) (XYLOCAINE) 1 % injection 2 mL (2 mLs Intradermal Given 07/30/18 1006)     Initial Impression / Assessment and Plan / ED Course  I have reviewed the triage  vital signs and the nursing notes.  Pertinent labs & imaging results that were available during my care of the patient were reviewed by me and considered in my medical decision making (see chart for details).    Pt requested treatment for gonorrhea and chlamydia, so she was given a dose of rocephin and zithromax.  Wet prep did not reveal yeast or bv.  Use condoms when having sex.  Pt instructed to f/u with women's clinic.  Return if worse.  Final Clinical Impressions(s) / ED Diagnoses   Final diagnoses:  Cervicitis    ED Discharge Orders    None       Jacalyn LefevreHaviland, Angelisa Winthrop, MD 07/30/18 1051

## 2018-07-30 NOTE — ED Notes (Signed)
NAD at this time. Pt is stable and going home.  

## 2018-07-31 LAB — GC/CHLAMYDIA PROBE AMP (~~LOC~~) NOT AT ARMC
Chlamydia: NEGATIVE
Neisseria Gonorrhea: NEGATIVE

## 2018-08-06 ENCOUNTER — Encounter (HOSPITAL_BASED_OUTPATIENT_CLINIC_OR_DEPARTMENT_OTHER): Payer: Self-pay | Admitting: Emergency Medicine

## 2018-08-06 ENCOUNTER — Other Ambulatory Visit: Payer: Self-pay

## 2018-08-06 ENCOUNTER — Emergency Department (HOSPITAL_BASED_OUTPATIENT_CLINIC_OR_DEPARTMENT_OTHER)
Admission: EM | Admit: 2018-08-06 | Discharge: 2018-08-06 | Disposition: A | Discharge status: Home / Self Care | Payer: Medicaid Other | Attending: Emergency Medicine | Admitting: Emergency Medicine

## 2018-08-06 DIAGNOSIS — Z87891 Personal history of nicotine dependence: Secondary | ICD-10-CM | POA: Diagnosis not present

## 2018-08-06 DIAGNOSIS — N898 Other specified noninflammatory disorders of vagina: Secondary | ICD-10-CM | POA: Insufficient documentation

## 2018-08-06 LAB — URINALYSIS, ROUTINE W REFLEX MICROSCOPIC
Bilirubin Urine: NEGATIVE
Glucose, UA: NEGATIVE mg/dL
Hgb urine dipstick: NEGATIVE
Ketones, ur: NEGATIVE mg/dL
Leukocytes, UA: NEGATIVE
NITRITE: NEGATIVE
PH: 6 (ref 5.0–8.0)
Protein, ur: NEGATIVE mg/dL
Specific Gravity, Urine: >1.03 — ABNORMAL HIGH (ref 1.005–1.030)

## 2018-08-06 LAB — WET PREP, GENITAL
CLUE CELLS WET PREP: NONE SEEN
Sperm: NONE SEEN
Trich, Wet Prep: NONE SEEN
WBC, Wet Prep HPF POC: NONE SEEN
Yeast Wet Prep HPF POC: NONE SEEN

## 2018-08-06 LAB — PREGNANCY, URINE: Preg Test, Ur: NEGATIVE

## 2018-08-06 NOTE — ED Triage Notes (Signed)
Reports continued vaginal discharge.

## 2018-08-06 NOTE — ED Provider Notes (Signed)
[facility] EMERGENCY DEPARTMENT Provider Note   CSN: [id number] Arrival date & time: 08/06/18  4098     History   Chief Complaint Chief Complaint  Patient presents with  . Vaginal Discharge    HPI Breanna Clark is a 22 y.o. female.  The history is provided by the patient.  Vaginal Discharge   This is a chronic problem. The current episode started more than 2 days ago. The problem occurs constantly. The problem has not changed since onset.The discharge occurs spontaneously. The discharge was white. She is not pregnant. She has not missed her period. Pertinent negatives include no anorexia, no diaphoresis, no fever, no abdominal pain, no vomiting and no dysuria. She has tried nothing for the symptoms. The treatment provided no relief. Her past medical history does not include STD.    History reviewed. No pertinent past medical history.  There are no active problems to display for this patient.   Past Surgical History:  Procedure Laterality Date  . CARDIAC SURGERY       OB History   None      Home Medications    Prior to Admission medications   Medication Sig Start Date End Date Taking? Authorizing Provider  cephALEXin (KEFLEX) 500 MG capsule Take 1 capsule (500 mg total) by mouth 2 (two) times daily. 03/11/18   Maczis, Elmer Sow, PA-C  clotrimazole (GYNE-LOTRIMIN) 1 % vaginal cream Place 1 Applicatorful vaginally at bedtime. For 7 days 01/29/18   Rolan Bucco, MD  metroNIDAZOLE (FLAGYL) 500 MG tablet Take 1 tablet (500 mg total) by mouth 2 (two) times daily. One po bid x 7 days 01/29/18   Rolan Bucco, MD    Family History History reviewed. No pertinent family history.  Social History Social History   Tobacco Use  . Smoking status: Former Games developer  . Smokeless tobacco: Never Used  Substance Use Topics  . Alcohol use: No  . Drug use: No     Allergies   Patient has no known allergies.   Review of Systems Review of Systems  Constitutional:  Negative for chills, diaphoresis and fever.  HENT: Negative for ear pain and sore throat.   Eyes: Negative for pain and visual disturbance.  Respiratory: Negative for cough and shortness of breath.   Cardiovascular: Negative for chest pain and palpitations.  Gastrointestinal: Negative for abdominal pain, anorexia and vomiting.  Genitourinary: Positive for vaginal discharge. Negative for decreased urine volume, dysuria, hematuria, vaginal bleeding and vaginal pain.  Musculoskeletal: Negative for arthralgias and back pain.  Skin: Negative for color change and rash.  Neurological: Negative for seizures and syncope.  All other systems reviewed and are negative.    Physical Exam Updated Vital Signs BP 102/68   Pulse 88   Temp 99.7 F (37.6 C) (Oral)   Resp 16   Ht 5' (1.524 m)   Wt 61.7 kg   LMP 07/21/2018 (Exact Date)   SpO2 100%   BMI 26.56 kg/m   Physical Exam  Constitutional: She appears well-developed and well-nourished. No distress.  HENT:  Head: Normocephalic and atraumatic.  Eyes: Pupils are equal, round, and reactive to light. Conjunctivae and EOM are normal.  Neck: Neck supple.  Cardiovascular: Normal rate and regular rhythm.  No murmur heard. Pulmonary/Chest: Effort normal and breath sounds normal. No respiratory distress.  Abdominal: Soft. There is no tenderness.  Genitourinary: Uterus normal. Cervix exhibits no motion tenderness and no discharge. Right adnexum displays no mass. Left adnexum displays no mass. No tenderness or  bleeding in the vagina. Vaginal discharge found.  Musculoskeletal: She exhibits no edema.  Neurological: She is alert.  Skin: Skin is warm and dry.  Psychiatric: She has a normal mood and affect.  Nursing note and vitals reviewed.    ED Treatments / Results  Labs (all labs ordered are listed, but only abnormal results are displayed) Labs Reviewed  URINALYSIS, ROUTINE W REFLEX MICROSCOPIC - Abnormal; Notable for the following components:        Result Value   Specific Gravity, Urine >1.030 (*)    All other components within normal limits  WET PREP, GENITAL  PREGNANCY, URINE  GC/CHLAMYDIA PROBE AMP ([place]) NOT AT [facility]    EKG None  Radiology No results found.  Procedures Procedures (including critical care time)  Medications Ordered in ED Medications - No data to display   Initial Impression / Assessment and Plan / ED Course  I have reviewed the triage vital signs and the nursing notes.  Pertinent labs & imaging results that were available during my care of the patient were reviewed by me and considered in my medical decision making (see chart for details).     Breanna Clark is a 22 year old female who presents to the ED with vaginal discharge.  Patient with normal vitals.  No fever.  Patient treated for STDs last week.  Had negative gonorrhea and Chlamydia testing at that time.  Continues to have discharge.  Patient with white discharge on exam.  Otherwise no tenderness.  No concern for PID.  Negative urinalysis, negative wet prep.  Likely physiologic discharge.  Patient is not pregnant.  Given reassurance.  Recommend follow-up with OB/GYN.  Discharged from ED in good condition.  Will hold on any STD testing at this time as patient has not had any new sex partner since last treatment.  This chart was dictated using voice recognition software.  Despite best efforts to proofread,  errors can occur which can change the documentation meaning.   Final Clinical Impressions(s) / ED Diagnoses   Final diagnoses:  Vaginal discharge    ED Discharge Orders    None       Virgina NorfolkCuratolo, Hugh Kamara, DO 08/06/18 1059

## 2018-08-07 LAB — GC/CHLAMYDIA PROBE AMP (~~LOC~~) NOT AT ARMC
Chlamydia: NEGATIVE
Neisseria Gonorrhea: NEGATIVE

## 2018-09-19 ENCOUNTER — Encounter (HOSPITAL_BASED_OUTPATIENT_CLINIC_OR_DEPARTMENT_OTHER): Payer: Self-pay | Admitting: Emergency Medicine

## 2018-09-19 ENCOUNTER — Emergency Department (HOSPITAL_BASED_OUTPATIENT_CLINIC_OR_DEPARTMENT_OTHER)
Admission: EM | Admit: 2018-09-19 | Discharge: 2018-09-19 | Disposition: A | Discharge status: Home / Self Care | Payer: Self-pay | Attending: Emergency Medicine | Admitting: Emergency Medicine

## 2018-09-19 ENCOUNTER — Emergency Department (HOSPITAL_BASED_OUTPATIENT_CLINIC_OR_DEPARTMENT_OTHER): Payer: Self-pay

## 2018-09-19 ENCOUNTER — Other Ambulatory Visit: Payer: Self-pay

## 2018-09-19 DIAGNOSIS — J069 Acute upper respiratory infection, unspecified: Secondary | ICD-10-CM | POA: Insufficient documentation

## 2018-09-19 MED ORDER — BENZONATATE 100 MG PO CAPS: 100.0000 mg | ORAL_CAPSULE | Freq: Three times a day (TID) | ORAL | 0 refills | Status: DC

## 2018-09-19 MED ORDER — HYDROMORPHONE HCL 1 MG/ML IJ SOLN: 1.0000 mg | Freq: Once | INTRAMUSCULAR | Status: DC

## 2018-09-19 MED ORDER — FLUTICASONE PROPIONATE 50 MCG/ACT NA SUSP: 1.0000 | Freq: Every day | NASAL | 0 refills | Status: DC

## 2018-09-19 MED ORDER — IBUPROFEN 800 MG PO TABS: 800.0000 mg | ORAL_TABLET | Freq: Three times a day (TID) | ORAL | 0 refills | Status: DC | PRN

## 2018-09-19 NOTE — ED Triage Notes (Signed)
Pt here with cough and congestion since Friday.

## 2018-09-19 NOTE — ED Notes (Signed)
NAD at this time. Pt is stable and going home.  

## 2018-09-19 NOTE — Discharge Instructions (Addendum)
You were seen in the emergency today for upper respiratory symptoms, we suspect your symptoms are related to allergies or a virus at this time.  Your xray was normal. We have prescribed you multiple medications to treat your symptoms.   -Flonase to be used 1 spray in each nostril daily.  This medication is used to treat your congestion.  -Tessalon can be taken once every 8 hours as needed.  This medication is used to treat your cough.  -Ibuprofen to be taken once every 8 hours as needed for pain. Please take this medicine with food as it can cause stomach upset and at worst stomach bleeding. Do not take other NSAIDs such as motrin, aleve, advil, naproxen, mobic, etc as they are similar. You make take tylenol per over the counter dosing with this medicine safely.  We have prescribed you new medication(s) today. Discuss the medications prescribed today with your pharmacist as they can have adverse effects and interactions with your other medicines including over the counter and prescribed medications. Seek medical evaluation if you start to experience new or abnormal symptoms after taking one of these medicines, seek care immediately if you start to experience difficulty breathing, feeling of your throat closing, facial swelling, or rash as these could be indications of a more serious allergic reaction  You will need to follow-up with your primary care provider in 1 week if your symptoms have not improved.  If you do not have a primary care provider one is provided in your discharge instructions.  Return to the emergency department for any new or worsening symptoms including but not limited to persistent fever for 5 days, difficulty breathing, chest pain, rashes, passing out, or any other concerns.

## 2018-09-19 NOTE — ED Provider Notes (Signed)
[facility] EMERGENCY DEPARTMENT Provider Note   CSN: [id number] Arrival date & time: 09/19/18  1004     History   Chief Complaint Chief Complaint  Patient presents with  . Cough  . Nasal Congestion    HPI Breanna Clark is a 23 y.o. 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  Patient states he is having congestion, rhinorrhea, chills, and cough productive of green to yellow mucus sputum.  She states that she is also having chest pain with coughing, otherwise no chest pain.  She tried some TheraFlu without relief.  No other alleviating or aggravating factors.  Denies fever, nausea, vomiting, diarrhea, abdominal pain, shortness of breath, or chance of pregnancy.  HPI  History reviewed. No pertinent past medical history.  There are no active problems to display for this patient.   Past Surgical History:  Procedure Laterality Date  . CARDIAC SURGERY       OB History   No obstetric history on file.      Home Medications    Prior to Admission medications   Medication Sig Start Date End Date Taking? Authorizing Provider  cephALEXin (KEFLEX) 500 MG capsule Take 1 capsule (500 mg total) by mouth 2 (two) times daily. 03/11/18   Maczis, Elmer Sow, PA-C  clotrimazole (GYNE-LOTRIMIN) 1 % vaginal cream Place 1 Applicatorful vaginally at bedtime. For 7 days 01/29/18   Rolan Bucco, MD  metroNIDAZOLE (FLAGYL) 500 MG tablet Take 1 tablet (500 mg total) by mouth 2 (two) times daily. One po bid x 7 days 01/29/18   Rolan Bucco, MD    Family History History reviewed. No pertinent family history.  Social History Social History   Tobacco Use  . Smoking status: Former Games developer  . Smokeless tobacco: Never Used  Substance Use Topics  . Alcohol use: No  . Drug use: No     Allergies   Patient has no known allergies.   Review of  Systems Review of Systems  Constitutional: Positive for chills. Negative for fever.  HENT: Positive for congestion and rhinorrhea. Negative for ear pain and sore throat.   Respiratory: Positive for cough. Negative for shortness of breath.   Cardiovascular: Positive for chest pain (w/ coughing).  Gastrointestinal: Negative for abdominal pain, diarrhea and vomiting.     Physical Exam Updated Vital Signs BP 114/86   Pulse 89   Temp 98.5 F (36.9 C) (Oral)   Resp 16   Ht 5' (1.524 m)   Wt 58.5 kg   LMP 09/11/2018 (Exact Date)   SpO2 100%   BMI 25.19 kg/m   Physical Exam Vitals signs and nursing note reviewed.  Constitutional:      General: She is not in acute distress.    Appearance: She is well-developed.  HENT:     Head: Normocephalic and atraumatic.     Right Ear: Tympanic membrane, ear canal and external ear normal. Tympanic membrane is not perforated, erythematous, retracted or bulging.     Left Ear: Tympanic membrane, ear canal and external ear normal. Tympanic membrane is not perforated, erythematous, retracted or bulging.     Nose: Congestion present.     Right Sinus: No maxillary sinus tenderness or frontal sinus tenderness.     Left Sinus: No maxillary sinus tenderness or frontal sinus tenderness.     Mouth/Throat:     Pharynx: Uvula midline. No  oropharyngeal exudate or posterior oropharyngeal erythema.     Comments: Posterior oropharynx is symmetric appearing. Patient tolerating own secretions without difficulty. No trismus. No drooling. No hot potato voice. No swelling beneath the tongue, submandibular compartment is soft.  Eyes:     General:        Right eye: No discharge.        Left eye: No discharge.     Conjunctiva/sclera: Conjunctivae normal.     Pupils: Pupils are equal, round, and reactive to light.  Neck:     Musculoskeletal: Normal range of motion and neck supple. No edema, neck rigidity or crepitus.  Cardiovascular:     Rate and Rhythm: Normal rate  and regular rhythm.     Heart sounds: Murmur present.  Pulmonary:     Effort: No respiratory distress.     Breath sounds: Normal breath sounds. No wheezing or rales.  Abdominal:     General: There is no distension.     Palpations: Abdomen is soft.     Tenderness: There is no abdominal tenderness.  Lymphadenopathy:     Cervical: No cervical adenopathy.  Skin:    General: Skin is warm and dry.     Findings: No rash.  Neurological:     Mental Status: She is alert.  Psychiatric:        Behavior: Behavior normal.      ED Treatments / Results  Labs (all labs ordered are listed, but only abnormal results are displayed) Labs Reviewed - No data to display  EKG None  Radiology Dg Chest 2 View  Result Date: 09/19/2018 CLINICAL DATA:  Cough, congestion EXAM: CHEST - 2 VIEW COMPARISON:  09/13/2017 FINDINGS: Heart and mediastinal contours are within normal limits. No focal opacities or effusions. No acute bony abnormality. IMPRESSION: No active cardiopulmonary disease. Electronically Signed   By: Charlett Nose M.D.   On: 09/19/2018 11:49    Procedures Procedures (including critical care time)  Medications Ordered in ED Medications - No data to display   Initial Impression / Assessment and Plan / ED Course  I have reviewed the triage vital signs and the nursing notes.  Pertinent labs & imaging results that were available during my care of the patient were reviewed by me and considered in my medical decision making (see chart for details).    Patient presents with URI type symptoms.  Patient is nontoxic appearing, in no apparent distress, vitals are WNL. Patient is afebrile in the ED, lungs are CTA, CXR negative for infiltrate, doubt pneumonia. There is no wheezing or signs of respiratory distress. Her chest pain is only with coughing, PERC negative, no chest pain currently, doubt ACS. Sxs onset < 7 days, afebrile, no sinus tenderness, doubt acute bacterial sinusitis. Centor score 0,  doubt strep pharyngitis. No evidence of AOM on exam. No meningeal signs. Suspect viral vs allergic etiology at this time and will treat supportively with Ibuprofen, Flonase, and Tessalon. I discussed results, treatment plan, need for PCP follow-up, and return precautions with the patient. Provided opportunity for questions, patient confirmed understanding and is in agreement with plan.    Final Clinical Impressions(s) / ED Diagnoses   Final diagnoses:  Upper respiratory tract infection, unspecified type    ED Discharge Orders         Ordered    fluticasone (FLONASE) 50 MCG/ACT nasal spray  Daily     09/19/18 1205    benzonatate (TESSALON) 100 MG capsule  Every 8 hours  09/19/18 1205    ibuprofen (ADVIL,MOTRIN) 800 MG tablet  Every 8 hours PRN     09/19/18 1205           Cherly Andersonetrucelli, Conner Muegge R, PA-C 09/19/18 1206    Tegeler, Canary Brimhristopher J, MD 09/19/18 1540

## 2018-10-04 IMAGING — CR DG CHEST 2V
2 series · 2 of 2 positions shown · non-contrast
Comparison: None.

CLINICAL DATA: Cough, shortness of breath, and fever.

EXAM:
CHEST  2 VIEW

[w chest pa]
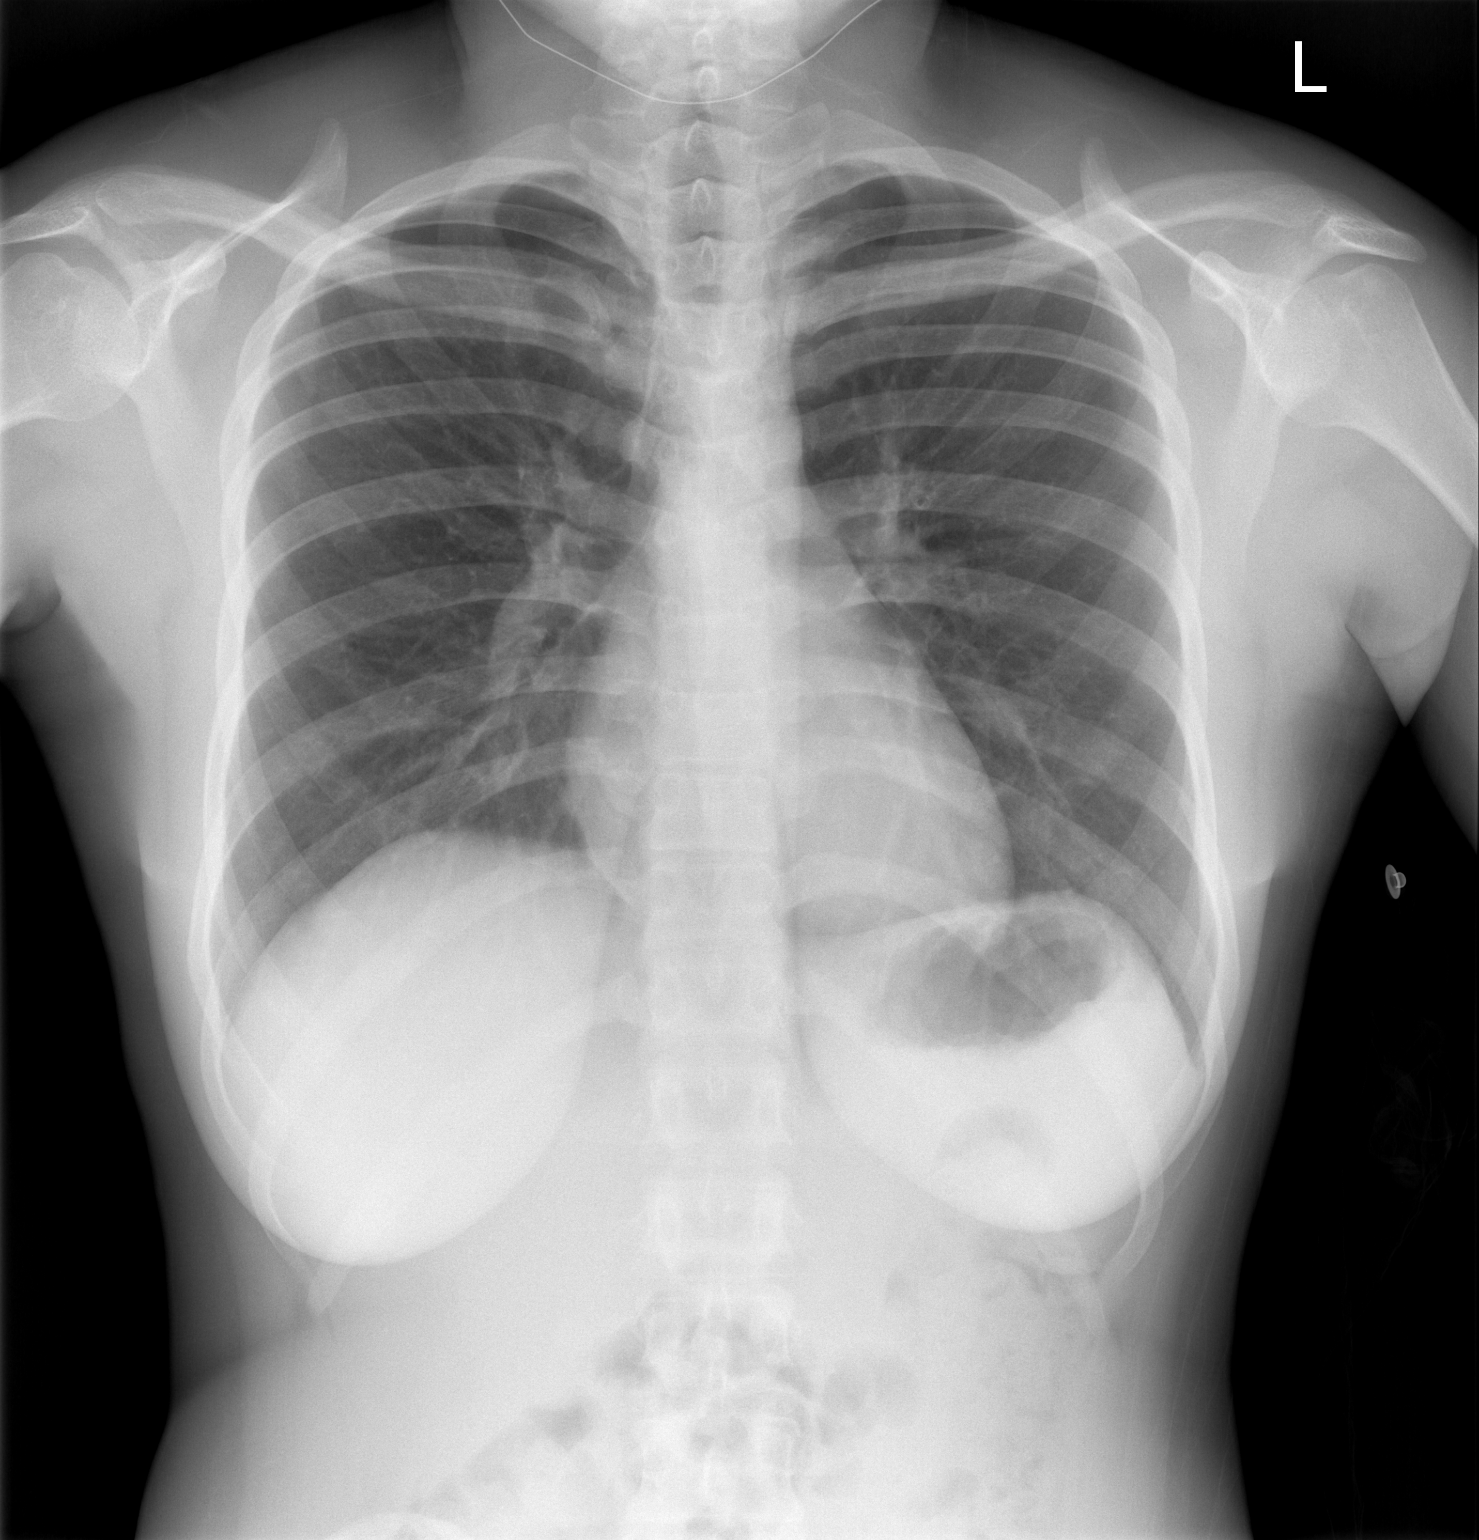

[w chest lat]
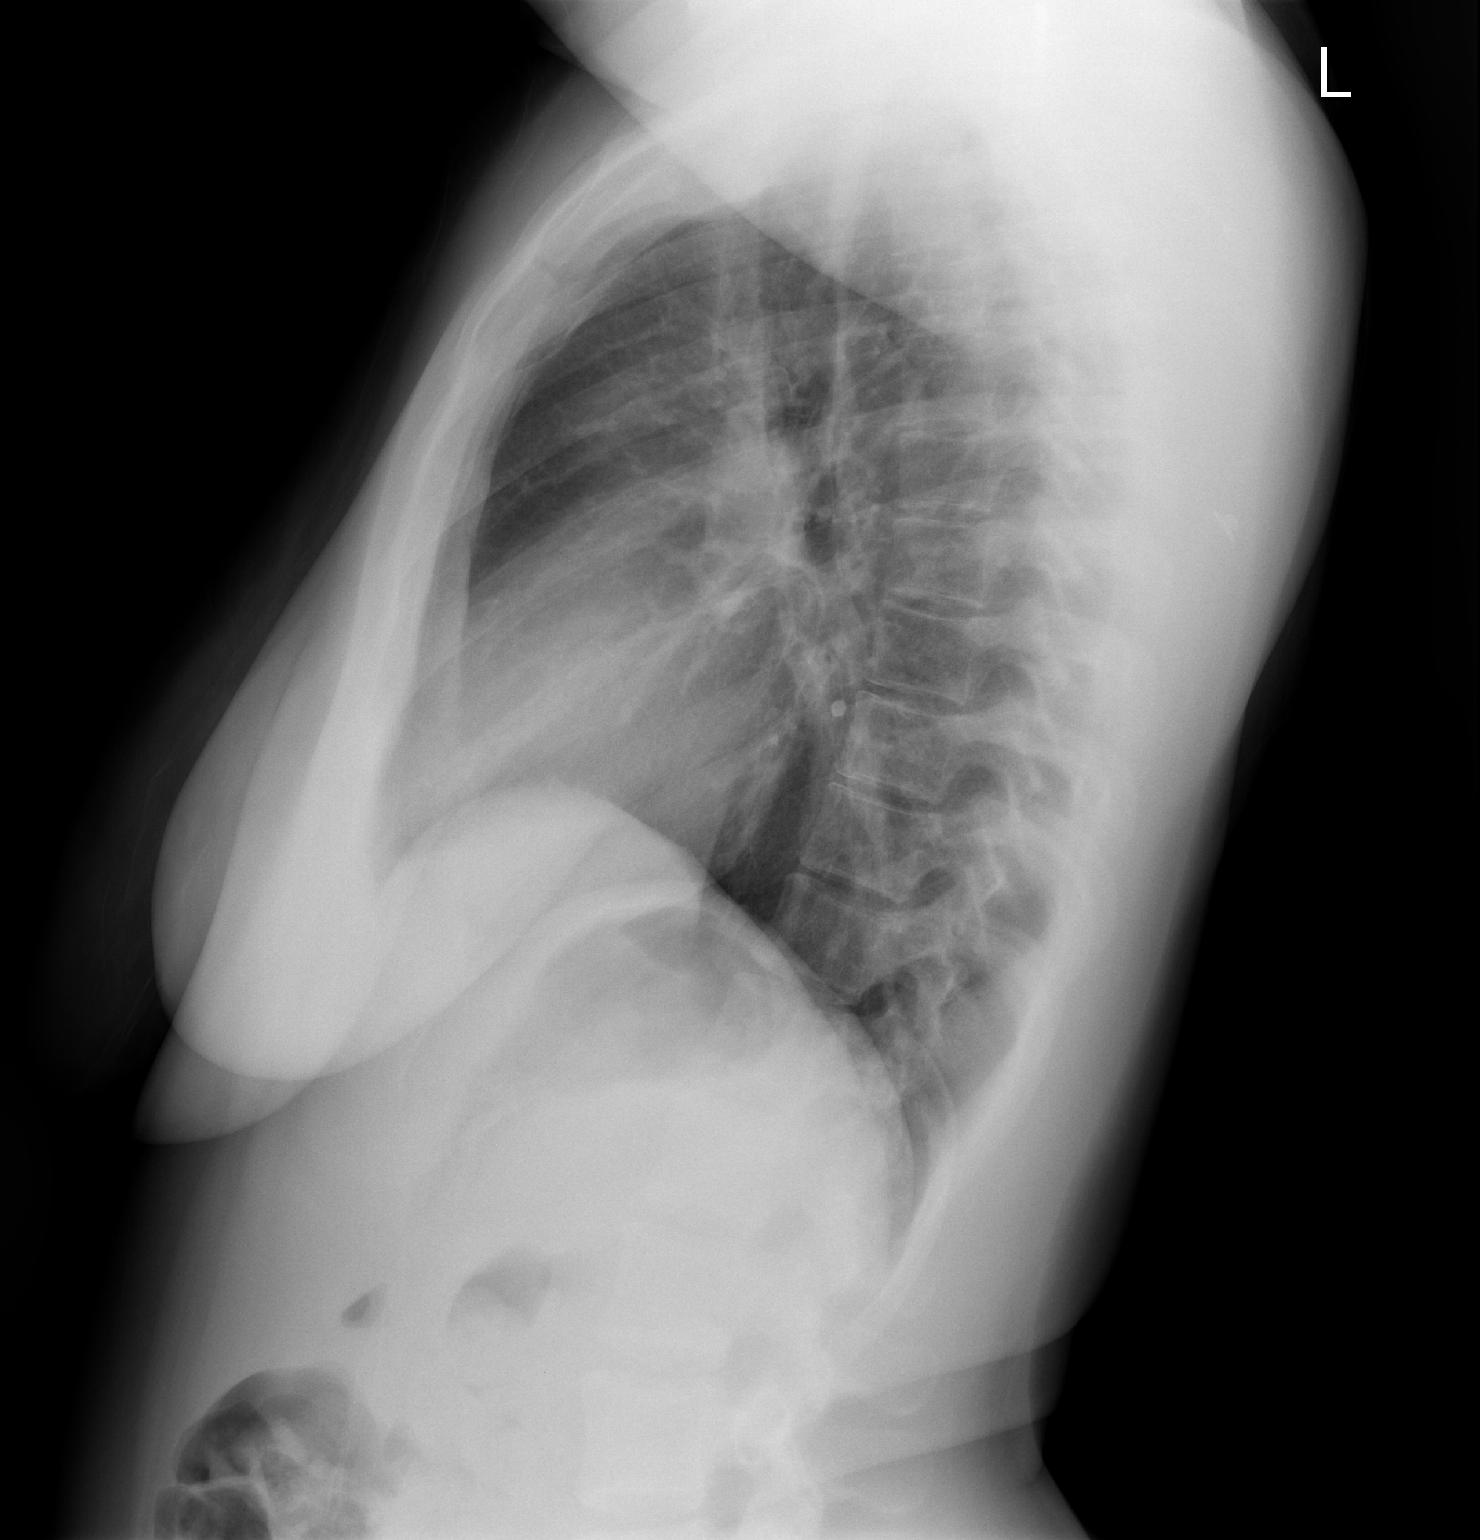

[2 of 2 positions shown; findings below may reference images not displayed]

FINDINGS: The heart size and mediastinal contours are within normal limits.
Both lungs are clear. The visualized skeletal structures are
unremarkable.
IMPRESSION: Normal chest x-ray.

## 2018-10-13 ENCOUNTER — Encounter (HOSPITAL_BASED_OUTPATIENT_CLINIC_OR_DEPARTMENT_OTHER): Payer: Self-pay | Admitting: Emergency Medicine

## 2018-10-13 ENCOUNTER — Emergency Department (HOSPITAL_BASED_OUTPATIENT_CLINIC_OR_DEPARTMENT_OTHER)
Admission: EM | Admit: 2018-10-13 | Discharge: 2018-10-13 | Disposition: A | Discharge status: Home / Self Care | Payer: Medicaid Other | Attending: Emergency Medicine | Admitting: Emergency Medicine

## 2018-10-13 ENCOUNTER — Other Ambulatory Visit: Payer: Self-pay

## 2018-10-13 DIAGNOSIS — Z87891 Personal history of nicotine dependence: Secondary | ICD-10-CM | POA: Insufficient documentation

## 2018-10-13 DIAGNOSIS — Z79899 Other long term (current) drug therapy: Secondary | ICD-10-CM | POA: Insufficient documentation

## 2018-10-13 DIAGNOSIS — Y998 Other external cause status: Secondary | ICD-10-CM | POA: Diagnosis not present

## 2018-10-13 DIAGNOSIS — M25562 Pain in left knee: Secondary | ICD-10-CM | POA: Diagnosis not present

## 2018-10-13 DIAGNOSIS — Y9389 Activity, other specified: Secondary | ICD-10-CM | POA: Insufficient documentation

## 2018-10-13 DIAGNOSIS — Y92410 Unspecified street and highway as the place of occurrence of the external cause: Secondary | ICD-10-CM | POA: Diagnosis not present

## 2018-10-13 DIAGNOSIS — M542 Cervicalgia: Secondary | ICD-10-CM | POA: Diagnosis not present

## 2018-10-13 NOTE — Discharge Instructions (Signed)
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
## 2019-06-26 ENCOUNTER — Encounter (HOSPITAL_BASED_OUTPATIENT_CLINIC_OR_DEPARTMENT_OTHER): Payer: Self-pay

## 2019-06-26 ENCOUNTER — Emergency Department (HOSPITAL_BASED_OUTPATIENT_CLINIC_OR_DEPARTMENT_OTHER)
Admission: EM | Admit: 2019-06-26 | Discharge: 2019-06-26 | Disposition: A | Discharge status: Home / Self Care | Payer: Medicaid Other | Attending: Emergency Medicine | Admitting: Emergency Medicine

## 2019-06-26 ENCOUNTER — Other Ambulatory Visit: Payer: Self-pay

## 2019-06-26 DIAGNOSIS — Z87891 Personal history of nicotine dependence: Secondary | ICD-10-CM | POA: Insufficient documentation

## 2019-06-26 DIAGNOSIS — L509 Urticaria, unspecified: Secondary | ICD-10-CM | POA: Insufficient documentation

## 2019-06-26 DIAGNOSIS — Z79899 Other long term (current) drug therapy: Secondary | ICD-10-CM | POA: Diagnosis not present

## 2019-06-26 MED ORDER — DEXAMETHASONE 4 MG PO TABS
8.0000 mg | ORAL_TABLET | Freq: Once | ORAL | Status: AC
  Administered 2019-06-26: 8 mg via ORAL
  Filled 2019-06-26: qty 2

## 2019-06-26 MED ORDER — EPINEPHRINE 0.3 MG/0.3ML IJ SOAJ: 0.3000 mg | INTRAMUSCULAR | 0 refills | Status: AC | PRN

## 2019-06-26 MED ORDER — DIPHENHYDRAMINE HCL 25 MG PO CAPS
50.0000 mg | ORAL_CAPSULE | Freq: Once | ORAL | Status: AC
  Administered 2019-06-26: 17:00:00 50 mg via ORAL
  Filled 2019-06-26: qty 2

## 2019-06-26 MED FILL — EPINEPHRINE 0.3 MG AUTO-INJ: 0.3 | 2 days supply | Qty: 2 | Fill #0

## 2019-06-26 NOTE — ED Triage Notes (Signed)
Pt c/o scattered hives since 3pm-states she has been having skin issues x months-went through allergy testing and was advised she is allergic to shrimp ate shrimp ~1pm-pt NAD-steady gait

## 2019-07-01 NOTE — ED Provider Notes (Signed)
[facility] EMERGENCY DEPARTMENT Provider Note   CSN: [id number] Arrival date & time: 06/26/19  1605     History   Chief Complaint Chief Complaint  Patient presents with  . Urticaria    HPI Breanna Clark is a 23 y.o. female.     HPI   23 year old female with urticaria.  She has no true allergy.  She ate some shrimp around 1 PM today.  Approximate 30 minutes later she developed diffuse itching and rash.  She is not tried take anything for symptoms.  Denies any respiratory complaints.  No GI complaints.  No dizziness or lightheadedness.  History reviewed. No pertinent past medical history.  There are no active problems to display for this patient.   Past Surgical History:  Procedure Laterality Date  . CARDIAC SURGERY       OB History   No obstetric history on file.      Home Medications    Prior to Admission medications   Medication Sig Start Date End Date Taking? Authorizing Provider  benzonatate (TESSALON) 100 MG capsule Take 1 capsule (100 mg total) by mouth every 8 (eight) hours. 09/19/18   Petrucelli, Samantha R, PA-C  EPINEPHrine 0.3 mg/0.3 mL IJ SOAJ injection Inject 0.3 mLs (0.3 mg total) into the muscle as needed for anaphylaxis. 06/26/19   Raeford Razor, MD  fluticasone (FLONASE) 50 MCG/ACT nasal spray Place 1 spray into both nostrils daily. 09/19/18   Petrucelli, Samantha R, PA-C  ibuprofen (ADVIL,MOTRIN) 800 MG tablet Take 1 tablet (800 mg total) by mouth every 8 (eight) hours as needed. 09/19/18   Petrucelli, Pleas Koch, PA-C    Family History No family history on file.  Social History Social History   Tobacco Use  . Smoking status: Former Games developer  . Smokeless tobacco: Never Used  Substance Use Topics  . Alcohol use: Yes    Comment: occ  . Drug use: No     Allergies   Dust mite extract, Grass extracts [gramineae pollens], and Shrimp [shellfish allergy]   Review of Systems Review of Systems  All systems reviewed and  negative, other than as noted in HPI.  Physical Exam Updated Vital Signs BP 120/71 (BP Location: Left Arm)   Pulse 82   Temp 98.8 F (37.1 C) (Oral)   Resp 18   Ht 5' (1.524 m)   Wt 64.9 kg   LMP 06/05/2019   SpO2 100%   BMI 27.93 kg/m   Physical Exam Vitals signs and nursing note reviewed.  Constitutional:      General: She is not in acute distress.    Appearance: She is well-developed.  HENT:     Head: Normocephalic and atraumatic.  Eyes:     General:        Right eye: No discharge.        Left eye: No discharge.     Conjunctiva/sclera: Conjunctivae normal.  Neck:     Musculoskeletal: Neck supple.  Cardiovascular:     Rate and Rhythm: Normal rate and regular rhythm.     Heart sounds: Normal heart sounds. No murmur. No friction rub. No gallop.   Pulmonary:     Effort: Pulmonary effort is normal. No respiratory distress.     Breath sounds: Normal breath sounds.  Abdominal:     General: There is no distension.     Palpations: Abdomen is soft.     Tenderness: There is no abdominal tenderness.  Musculoskeletal:        General: No  tenderness.  Skin:    General: Skin is warm and dry.     Comments: Scattered urticarial lesions.  Neurological:     Mental Status: She is alert.  Psychiatric:        Behavior: Behavior normal.        Thought Content: Thought content normal.      ED Treatments / Results  Labs (all labs ordered are listed, but only abnormal results are displayed) Labs Reviewed - No data to display  EKG None  Radiology No results found.  Procedures Procedures (including critical care time)  Medications Ordered in ED Medications  diphenhydrAMINE (BENADRYL) capsule 50 mg (50 mg Oral Given 06/26/19 1636)  dexamethasone (DECADRON) tablet 8 mg (8 mg Oral Given 06/26/19 1636)     Initial Impression / Assessment and Plan / ED Course  I have reviewed the triage vital signs and the nursing notes.  Pertinent labs & imaging results that were  available during my care of the patient were reviewed by me and considered in my medical decision making (see chart for details).        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  Ate shrimp.  Now with hives.  No respiratory or GI complaints.  Hemodynamically stable.  Plan symptomatic treatment with continued antihistamines.  Return precautions were discussed.  Prescription for EpiPen's was provided and detailed indications for usage.  Final Clinical Impressions(s) / ED Diagnoses   Final diagnoses:  Urticaria    ED Discharge Orders         Ordered    EPINEPHrine 0.3 mg/0.3 mL IJ SOAJ injection  As needed     06/26/19 1632           Virgel Manifold, MD 07/01/19 1126

## 2019-07-14 ENCOUNTER — Other Ambulatory Visit: Payer: Self-pay

## 2019-07-14 ENCOUNTER — Emergency Department (HOSPITAL_BASED_OUTPATIENT_CLINIC_OR_DEPARTMENT_OTHER)
Admission: EM | Admit: 2019-07-14 | Discharge: 2019-07-14 | Disposition: A | Discharge status: Home / Self Care | Payer: Medicaid Other | Attending: Emergency Medicine | Admitting: Emergency Medicine

## 2019-07-14 ENCOUNTER — Emergency Department (HOSPITAL_BASED_OUTPATIENT_CLINIC_OR_DEPARTMENT_OTHER): Payer: Medicaid Other

## 2019-07-14 ENCOUNTER — Encounter (HOSPITAL_BASED_OUTPATIENT_CLINIC_OR_DEPARTMENT_OTHER): Payer: Self-pay | Admitting: Emergency Medicine

## 2019-07-14 DIAGNOSIS — Z87891 Personal history of nicotine dependence: Secondary | ICD-10-CM | POA: Insufficient documentation

## 2019-07-14 DIAGNOSIS — N760 Acute vaginitis: Secondary | ICD-10-CM | POA: Diagnosis not present

## 2019-07-14 DIAGNOSIS — Z79899 Other long term (current) drug therapy: Secondary | ICD-10-CM | POA: Insufficient documentation

## 2019-07-14 DIAGNOSIS — B9689 Other specified bacterial agents as the cause of diseases classified elsewhere: Secondary | ICD-10-CM

## 2019-07-14 DIAGNOSIS — R102 Pelvic and perineal pain: Secondary | ICD-10-CM | POA: Insufficient documentation

## 2019-07-14 LAB — URINALYSIS, ROUTINE W REFLEX MICROSCOPIC
Bilirubin Urine: NEGATIVE
Glucose, UA: NEGATIVE mg/dL
Hgb urine dipstick: NEGATIVE
Ketones, ur: NEGATIVE mg/dL
Leukocytes,Ua: NEGATIVE
Nitrite: NEGATIVE
Protein, ur: NEGATIVE mg/dL
Specific Gravity, Urine: >1.03 — ABNORMAL HIGH (ref 1.005–1.030)
pH: 6 (ref 5.0–8.0)

## 2019-07-14 LAB — WET PREP, GENITAL
Sperm: NONE SEEN
Trich, Wet Prep: NONE SEEN
Yeast Wet Prep HPF POC: NONE SEEN

## 2019-07-14 LAB — PREGNANCY, URINE: Preg Test, Ur: NEGATIVE

## 2019-07-14 MED ORDER — IBUPROFEN 800 MG PO TABS
800.0000 mg | ORAL_TABLET | Freq: Once | ORAL | Status: AC
  Administered 2019-07-14: 800 mg via ORAL
  Filled 2019-07-14: qty 1

## 2019-07-14 MED ORDER — METRONIDAZOLE 500 MG PO TABS: 500.0000 mg | ORAL_TABLET | Freq: Two times a day (BID) | ORAL | 0 refills | Status: DC

## 2019-07-14 MED ORDER — FLUCONAZOLE 150 MG PO TABS: 150.0000 mg | ORAL_TABLET | Freq: Every day | ORAL | 0 refills | Status: AC

## 2019-07-14 NOTE — ED Notes (Signed)
Patient transported to Ultrasound 

## 2019-07-14 NOTE — ED Notes (Signed)
Patient verbalizes understanding of discharge instructions. Opportunity for questioning and answers were provided. Armband removed by staff, pt discharged from ED.  

## 2019-07-14 NOTE — ED Provider Notes (Signed)
[facility] EMERGENCY DEPARTMENT Provider Note   CSN: [id number] Arrival date & time: 07/14/19  1206     History   Chief Complaint Chief Complaint  Patient presents with  . Pelvic Pain    HPI Breanna Clark is a 23 y.o. female.     The history is provided by the patient and medical records. No language interpreter was used.  Pelvic Pain   Breanna Clark is a 23 y.o. female who presents to the Emergency Department complaining of pelvic pain. She presents the emergency department complaining of pelvic pain that began yesterday. Pain is described as sharp and in her central and bilateral lower pelvic region. It is waxing and waning. It is worse with movement. She denies any associated fevers, nausea, vomiting, diarrhea, dysuria. She has mild vaginally discharge is unchanged from her baseline. LMP was about one month ago. No new sexual partners. She is a G2 P1011. History reviewed. No pertinent past medical history.  There are no active problems to display for this patient.   Past Surgical History:  Procedure Laterality Date  . CARDIAC SURGERY       OB History   No obstetric history on file.      Home Medications    Prior to Admission medications   Medication Sig Start Date End Date Taking? Authorizing Provider  benzonatate (TESSALON) 100 MG capsule Take 1 capsule (100 mg total) by mouth every 8 (eight) hours. 09/19/18   Petrucelli, Samantha R, PA-C  EPINEPHrine 0.3 mg/0.3 mL IJ SOAJ injection Inject 0.3 mLs (0.3 mg total) into the muscle as needed for anaphylaxis. 06/26/19   Raeford Razor, MD  fluconazole (DIFLUCAN) 150 MG tablet Take 1 tablet (150 mg total) by mouth daily for 1 day. 07/14/19 07/15/19  Tilden Fossa, MD  fluticasone [facility]) 50 MCG/ACT nasal spray Place 1 spray into both nostrils daily. 09/19/18   Petrucelli, Samantha R, PA-C  ibuprofen (ADVIL,MOTRIN) 800 MG tablet Take 1 tablet (800 mg total) by mouth every 8 (eight) hours as needed.  09/19/18   Petrucelli, Samantha R, PA-C  metroNIDAZOLE (FLAGYL) 500 MG tablet Take 1 tablet (500 mg total) by mouth 2 (two) times daily. 07/14/19   Tilden Fossa, MD    Family History No family history on file.  Social History Social History   Tobacco Use  . Smoking status: Former Games developer  . Smokeless tobacco: Never Used  Substance Use Topics  . Alcohol use: Yes    Comment: occ  . Drug use: No     Allergies   Dust mite extract, Grass extracts [gramineae pollens], and Shrimp [shellfish allergy]   Review of Systems Review of Systems  Genitourinary: Positive for pelvic pain.  All other systems reviewed and are negative.    Physical Exam Updated Vital Signs BP 121/87 (BP Location: Right Arm)   Pulse 76   Temp 99.8 F (37.7 C) (Oral)   Resp 18   Ht 5' (1.524 m)   Wt 63.5 kg   LMP 06/24/2019   SpO2 100%   BMI 27.34 kg/m   Physical Exam Vitals signs and nursing note reviewed.  Constitutional:      Appearance: She is well-developed.  HENT:     Head: Normocephalic and atraumatic.  Cardiovascular:     Rate and Rhythm: Normal rate and regular rhythm.  Pulmonary:     Effort: Pulmonary effort is normal. No respiratory distress.  Abdominal:     Palpations: Abdomen is soft.     Tenderness: There is no  guarding or rebound.     Comments: Mild suprapubic tenderness  Genitourinary:    Comments: Scant white vaginal discharge. No CMT or adnexal tenderness.  No mass Musculoskeletal:        General: No tenderness.  Skin:    General: Skin is warm and dry.  Neurological:     Mental Status: She is alert and oriented to person, place, and time.  Psychiatric:        Behavior: Behavior normal.      ED Treatments / Results  Labs (all labs ordered are listed, but only abnormal results are displayed) Labs Reviewed  WET PREP, GENITAL - Abnormal; Notable for the following components:      Result Value   Clue Cells Wet Prep HPF POC PRESENT (*)    WBC, Wet Prep HPF POC FEW  (*)    All other components within normal limits  URINALYSIS, ROUTINE W REFLEX MICROSCOPIC - Abnormal; Notable for the following components:   Specific Gravity, Urine >1.030 (*)    All other components within normal limits  PREGNANCY, URINE  GC/CHLAMYDIA PROBE AMP ([place]) NOT AT [facility]    EKG None  Radiology US Pelvic Complete W Transvaginal And Torsion R/o  Result Date: 07/14/2019 CLINICAL DATA:  Patient with generalized pelvic pain. EXAM: TRANSABDOMINAL AND TRANSVAGINAL ULTRASOUND OF PELVIS DOPPLER ULTRASOUND OF OVARIES TECHNIQUE: Both transabdominal and transvaginal ultrasound examinations of the pelvis were performed. Transabdominal technique was performed for global imaging of the pelvis including uterus, ovaries, adnexal regions, and pelvic cul-de-sac. It was necessary to proceed with endovaginal exam following the transabdominal exam to visualize the adnexal structures. Color and duplex Doppler ultrasound was utilized to evaluate blood flow to the ovaries. COMPARISON:  None. FINDINGS: Uterus Measurements: 9.9 x 4.1 x 4.7 cm = volume: 99.7 mL. No fibroids or other mass visualized. Endometrium Thickness: 17 mm.  No focal abnormality visualized. Right ovary Measurements: 3.9 x 2.2 x 2.1 cm = volume: 9.2 mL. Normal appearance/no adnexal mass. Left ovary Measurements: 3.6 x 2.4 x 2.3 cm = volume: 10.4 mL. Normal appearance/no adnexal mass. Pulsed Doppler evaluation of both ovaries demonstrates normal low-resistance arterial and venous waveforms. Other findings Trace pelvic free fluid. IMPRESSION: No acute process within the pelvis. Electronically Signed   By: Lovey Newcomer M.D.   On: 07/14/2019 16:47    Procedures Procedures (including critical care time)  Medications Ordered in ED Medications  ibuprofen (ADVIL) tablet 800 mg (800 mg Oral Given 07/14/19 1637)     Initial Impression / Assessment and Plan / ED Course  I have reviewed the triage vital signs and the nursing notes.   Pertinent labs & imaging results that were available during my care of the patient were reviewed by me and considered in my medical decision making (see chart for details).        Patient here for evaluation of pelvic pain that started last night. She is non-toxic appearing on evaluation with mild suprapubic tenderness. Pelvic examination was benign. Wet prep is positive for clue cells, discussed with patient BV. Also discussed pelvic pain of unclear etiology. Discussed home care, outpatient follow-up and return precautions. Presentation is not consistent with appendicitis, ovarian torsion, tube ovarian abscess, PID.  Final Clinical Impressions(s) / ED Diagnoses   Final diagnoses:  Pelvic pain in female  BV (bacterial vaginosis)    ED Discharge Orders         Ordered    metroNIDAZOLE (FLAGYL) 500 MG tablet  2 times daily  07/14/19 1701    fluconazole (DIFLUCAN) 150 MG tablet  Daily     07/14/19 1701           Tilden Fossaees, Shakeerah Gradel, MD 07/14/19 1703

## 2019-07-14 NOTE — ED Notes (Signed)
Pt to u/s

## 2019-07-14 NOTE — ED Notes (Signed)
Pelvic at room

## 2019-07-14 NOTE — ED Triage Notes (Signed)
Pelvic pain since yesterday. Denies discharge or bleeding.

## 2019-07-16 LAB — GC/CHLAMYDIA PROBE AMP (~~LOC~~) NOT AT ARMC
Chlamydia: POSITIVE — AB
Neisseria Gonorrhea: NEGATIVE

## 2019-08-21 ENCOUNTER — Encounter (HOSPITAL_BASED_OUTPATIENT_CLINIC_OR_DEPARTMENT_OTHER): Payer: Self-pay | Admitting: *Deleted

## 2019-08-21 ENCOUNTER — Other Ambulatory Visit: Payer: Self-pay

## 2019-08-21 ENCOUNTER — Emergency Department (HOSPITAL_BASED_OUTPATIENT_CLINIC_OR_DEPARTMENT_OTHER)
Admission: EM | Admit: 2019-08-21 | Discharge: 2019-08-22 | Disposition: A | Discharge status: Home / Self Care | Payer: Medicaid Other | Attending: Emergency Medicine | Admitting: Emergency Medicine

## 2019-08-21 DIAGNOSIS — R21 Rash and other nonspecific skin eruption: Secondary | ICD-10-CM | POA: Diagnosis not present

## 2019-08-21 DIAGNOSIS — N898 Other specified noninflammatory disorders of vagina: Secondary | ICD-10-CM | POA: Insufficient documentation

## 2019-08-21 DIAGNOSIS — Z87891 Personal history of nicotine dependence: Secondary | ICD-10-CM | POA: Diagnosis not present

## 2019-08-21 DIAGNOSIS — L509 Urticaria, unspecified: Secondary | ICD-10-CM

## 2019-08-21 DIAGNOSIS — Z79899 Other long term (current) drug therapy: Secondary | ICD-10-CM | POA: Insufficient documentation

## 2019-08-21 LAB — URINALYSIS, ROUTINE W REFLEX MICROSCOPIC
Bilirubin Urine: NEGATIVE
Glucose, UA: NEGATIVE mg/dL
Hgb urine dipstick: NEGATIVE
Ketones, ur: NEGATIVE mg/dL
Leukocytes,Ua: NEGATIVE
Nitrite: NEGATIVE
Protein, ur: NEGATIVE mg/dL
Specific Gravity, Urine: >1.03 — ABNORMAL HIGH (ref 1.005–1.030)
pH: 6 (ref 5.0–8.0)

## 2019-08-21 LAB — WET PREP, GENITAL
Clue Cells Wet Prep HPF POC: NONE SEEN
Sperm: NONE SEEN
Trich, Wet Prep: NONE SEEN
Yeast Wet Prep HPF POC: NONE SEEN

## 2019-08-21 LAB — PREGNANCY, URINE: Preg Test, Ur: NEGATIVE

## 2019-08-21 MED ORDER — FLUCONAZOLE 150 MG PO TABS
150.0000 mg | ORAL_TABLET | Freq: Once | ORAL | Status: AC
  Administered 2019-08-22: 150 mg via ORAL
  Filled 2019-08-21: qty 1

## 2019-08-21 NOTE — ED Provider Notes (Signed)
[place] [facility] Provider Note: Georgena Spurling, MD, FACEP  CSN: [id number] MRN: [id number] ARRIVAL: 08/21/19 at 2253 ROOM: [place]  Vaginal Discharge   HISTORY OF PRESENT ILLNESS  08/21/19 11:10 PM Breanna Clark is a 23 y.o. female with a 3-day history of a vaginal discharge.  She describes the discharge as white and having somewhat of a unusual odor.  She is having no associated pain.  She is having no associated dysuria.  She has had a rash in various places on her body, notably her left thigh.  This rash is evanescent and not present currently.  She has been taking cetirizine for it.   History reviewed. No pertinent past medical history.  Past Surgical History:  Procedure Laterality Date  . CARDIAC SURGERY      No family history on file.  Social History   Tobacco Use  . Smoking status: Former Research scientist (life sciences)  . Smokeless tobacco: Never Used  Substance Use Topics  . Alcohol use: Yes    Comment: occ  . Drug use: No    Prior to Admission medications   Medication Sig Start Date End Date Taking? Authorizing Provider  benzonatate (TESSALON) 100 MG capsule Take 1 capsule (100 mg total) by mouth every 8 (eight) hours. 09/19/18   Petrucelli, Samantha R, PA-C  EPINEPHrine 0.3 mg/0.3 mL IJ SOAJ injection Inject 0.3 mLs (0.3 mg total) into the muscle as needed for anaphylaxis. 06/26/19   Virgel Manifold, MD  fluticasone (FLONASE) 50 MCG/ACT nasal spray Place 1 spray into both nostrils daily. 09/19/18   Petrucelli, Samantha R, PA-C  ibuprofen (ADVIL,MOTRIN) 800 MG tablet Take 1 tablet (800 mg total) by mouth every 8 (eight) hours as needed. 09/19/18   Petrucelli, Samantha R, PA-C    Allergies Dust mite extract, Grass extracts [gramineae pollens], and Shrimp [shellfish allergy]   REVIEW OF SYSTEMS  Negative except as noted here or in the History of Present Illness.   PHYSICAL EXAMINATION  Initial Vital Signs Blood pressure 121/87, pulse 89, temperature  98.5 F (36.9 C), resp. rate 18, height 5' (1.524 m), weight 63.5 kg, last menstrual period 07/25/2019, SpO2 100 %.  Examination General: Well-developed, well-nourished female in no acute distress; appearance consistent with age of record HENT: normocephalic; atraumatic Eyes: pupils equal, round and reactive to light; extraocular muscles intact Neck: supple Heart: regular rate and rhythm Lungs: clear to auscultation bilaterally Abdomen: soft; nondistended; nontender; bowel sounds present GU: Normal external genitalia; white vaginal discharge; no vaginal bleeding; no cervical motion tenderness; no adnexal tenderness; vaginal burning on examination Extremities: No deformity; full range of motion Neurologic: Awake, alert and oriented; motor function intact in all extremities and symmetric; no facial droop Skin: Warm and dry; no rash Psychiatric: Normal mood and affect   RESULTS  Summary of this visit's results, reviewed and interpreted by myself:   EKG Interpretation  Date/Time:    Ventricular Rate:    PR Interval:    QRS Duration:   QT Interval:    QTC Calculation:   R Axis:     Text Interpretation:        Laboratory Studies: Results for orders placed or performed during the hospital encounter of 08/21/19 (from the past 24 hour(s))  Urinalysis, Routine w reflex microscopic     Status: Abnormal   Collection Time: 08/21/19 11:21 PM  Result Value Ref Range   Color, Urine YELLOW YELLOW   APPearance CLOUDY (A) CLEAR   Specific Gravity, Urine >1.030 (H) 1.005 -  1.030   pH 6.0 5.0 - 8.0   Glucose, UA NEGATIVE NEGATIVE mg/dL   Hgb urine dipstick NEGATIVE NEGATIVE   Bilirubin Urine NEGATIVE NEGATIVE   Ketones, ur NEGATIVE NEGATIVE mg/dL   Protein, ur NEGATIVE NEGATIVE mg/dL   Nitrite NEGATIVE NEGATIVE   Leukocytes,Ua NEGATIVE NEGATIVE  Pregnancy, urine     Status: None   Collection Time: 08/21/19 11:21 PM  Result Value Ref Range   Preg Test, Ur NEGATIVE NEGATIVE  Wet prep,  genital     Status: Abnormal   Collection Time: 08/21/19 11:21 PM   Specimen: Urine, Clean Catch  Result Value Ref Range   Yeast Wet Prep HPF POC NONE SEEN NONE SEEN   Trich, Wet Prep NONE SEEN NONE SEEN   Clue Cells Wet Prep HPF POC NONE SEEN NONE SEEN   WBC, Wet Prep HPF POC FEW (A) NONE SEEN   Sperm NONE SEEN    Imaging Studies: No results found.  ED COURSE and MDM  Nursing notes, initial and subsequent vitals signs, including pulse oximetry, reviewed and interpreted by myself.  Vitals:   08/21/19 2305 08/21/19 2308 08/21/19 2309  BP:   121/87  Pulse:  89   Resp:  18   Temp:  98.5 F (36.9 C)   SpO2:  100%   Weight: 63.5 kg    Height: 5' (1.524 m)     Medications  fluconazole (DIFLUCAN) tablet 150 mg (has no administration in time range)    Wet prep is negative but yeast infections often go undetected on wet prep.  Patient symptoms are consistent with a yeast vulvovaginitis and we will treat with Diflucan.  GC and Chlamydia testing are pending.  The patient's rash is described as consistent with urticaria.  She was advised to continue the cetirizine.  PROCEDURES  Procedures   ED DIAGNOSES     ICD-10-CM   1. Vaginal discharge  N89.8   2. Urticaria  L50.9        Kenyatte Chatmon, MD 08/21/19 2349

## 2019-08-21 NOTE — ED Triage Notes (Signed)
Pt c/o vaginal discharge x 3 days also c/o of rash to left thigh

## 2019-08-23 LAB — GC/CHLAMYDIA PROBE AMP (~~LOC~~) NOT AT ARMC
Chlamydia: NEGATIVE
Neisseria Gonorrhea: NEGATIVE

## 2019-10-13 ENCOUNTER — Emergency Department (HOSPITAL_BASED_OUTPATIENT_CLINIC_OR_DEPARTMENT_OTHER)
Admission: EM | Admit: 2019-10-13 | Discharge: 2019-10-13 | Disposition: A | Discharge status: Home / Self Care | Payer: Medicaid Other | Attending: Emergency Medicine | Admitting: Emergency Medicine

## 2019-10-13 ENCOUNTER — Other Ambulatory Visit: Payer: Self-pay

## 2019-10-13 DIAGNOSIS — Z79899 Other long term (current) drug therapy: Secondary | ICD-10-CM | POA: Insufficient documentation

## 2019-10-13 DIAGNOSIS — B3731 Acute candidiasis of vulva and vagina: Secondary | ICD-10-CM

## 2019-10-13 DIAGNOSIS — B373 Candidiasis of vulva and vagina: Secondary | ICD-10-CM | POA: Diagnosis not present

## 2019-10-13 DIAGNOSIS — Z87891 Personal history of nicotine dependence: Secondary | ICD-10-CM | POA: Diagnosis not present

## 2019-10-13 DIAGNOSIS — Z91013 Allergy to seafood: Secondary | ICD-10-CM | POA: Diagnosis not present

## 2019-10-13 DIAGNOSIS — N899 Noninflammatory disorder of vagina, unspecified: Secondary | ICD-10-CM | POA: Diagnosis present

## 2019-10-13 LAB — URINALYSIS, ROUTINE W REFLEX MICROSCOPIC
Bilirubin Urine: NEGATIVE
Glucose, UA: NEGATIVE mg/dL
Hgb urine dipstick: NEGATIVE
Ketones, ur: NEGATIVE mg/dL
Leukocytes,Ua: NEGATIVE
Nitrite: NEGATIVE
Protein, ur: NEGATIVE mg/dL
Specific Gravity, Urine: >1.03 — ABNORMAL HIGH (ref 1.005–1.030)
pH: 6 (ref 5.0–8.0)

## 2019-10-13 LAB — PREGNANCY, URINE: Preg Test, Ur: NEGATIVE

## 2019-10-13 LAB — WET PREP, GENITAL
Clue Cells Wet Prep HPF POC: NONE SEEN
Sperm: NONE SEEN
Trich, Wet Prep: NONE SEEN
Yeast Wet Prep HPF POC: NONE SEEN

## 2019-10-13 MED ORDER — FLUCONAZOLE 150 MG PO TABS
150.0000 mg | ORAL_TABLET | Freq: Once | ORAL | Status: AC
  Administered 2019-10-13: 150 mg via ORAL
  Filled 2019-10-13: qty 1

## 2019-10-13 MED ORDER — MONISTAT 1 COMBO PACK 1200 & 2 MG & % VA KIT: 1.0000 | PACK | Freq: Once | VAGINAL | 0 refills | Status: AC

## 2019-10-13 NOTE — ED Provider Notes (Signed)
[place] EMERGENCY DEPARTMENT Provider Note   CSN: [id number] Arrival date & time: 10/13/19  7510     History No chief complaint on file.   Breanna Clark is a 24 y.o. female.  Pt presents to the ED today with vaginal irritation and white d/c.  She has a hx of BV and yeast infections.  She is sexually active.  She uses a condom when she has sex.  She denies irritation with the condom.  LMP 1/17. She denies any other sx.        No past medical history on file.  There are no problems to display for this patient.   Past Surgical History:  Procedure Laterality Date  . CARDIAC SURGERY       OB History   No obstetric history on file.     No family history on file.  Social History   Tobacco Use  . Smoking status: Former Research scientist (life sciences)  . Smokeless tobacco: Never Used  Substance Use Topics  . Alcohol use: Yes    Comment: occ  . Drug use: No    Home Medications Prior to Admission medications   Medication Sig Start Date End Date Taking? Authorizing Provider  benzonatate (TESSALON) 100 MG capsule Take 1 capsule (100 mg total) by mouth every 8 (eight) hours. 09/19/18   Petrucelli, Samantha R, PA-C  EPINEPHrine 0.3 mg/0.3 mL IJ SOAJ injection Inject 0.3 mLs (0.3 mg total) into the muscle as needed for anaphylaxis. 06/26/19   Virgel Manifold, MD  fluticasone (FLONASE) 50 MCG/ACT nasal spray Place 1 spray into both nostrils daily. 09/19/18   Petrucelli, Samantha R, PA-C  ibuprofen (ADVIL,MOTRIN) 800 MG tablet Take 1 tablet (800 mg total) by mouth every 8 (eight) hours as needed. 09/19/18   Petrucelli, Samantha R, PA-C  miconazole (MONISTAT 1 COMBO PACK) kit Place 1 each vaginally once for 1 dose. 10/13/19 10/13/19  Isla Pence, MD    Allergies    Dust mite extract, Grass extracts [gramineae pollens], and Shrimp [shellfish allergy]  Review of Systems   Review of Systems  Genitourinary:       Vaginal irritation  All other systems reviewed and are  negative.   Physical Exam Updated Vital Signs BP 117/84   Pulse 81   Temp 98.6 F (37 C) (Oral)   Resp 14   LMP 09/22/2019 (Exact Date)   SpO2 100%   Physical Exam Vitals and nursing note reviewed. Exam conducted with a chaperone present.  Constitutional:      Appearance: Normal appearance.  HENT:     Head: Normocephalic and atraumatic.     Right Ear: External ear normal.     Left Ear: External ear normal.     Nose: Nose normal.     Mouth/Throat:     Mouth: Mucous membranes are moist.     Pharynx: Oropharynx is clear.  Eyes:     Extraocular Movements: Extraocular movements intact.     Conjunctiva/sclera: Conjunctivae normal.     Pupils: Pupils are equal, round, and reactive to light.  Cardiovascular:     Rate and Rhythm: Normal rate and regular rhythm.     Pulses: Normal pulses.     Heart sounds: Normal heart sounds.  Pulmonary:     Effort: Pulmonary effort is normal.  Abdominal:     General: Abdomen is flat. Bowel sounds are normal.     Palpations: Abdomen is soft.  Genitourinary:    General: Normal vulva.     Exam position: Lithotomy  position.     Vagina: Vaginal discharge present.     Cervix: Discharge present.     Uterus: Normal.      Adnexa: Right adnexa normal and left adnexa normal.     Comments: White d/c noted Musculoskeletal:        General: Normal range of motion.     Cervical back: Normal range of motion and neck supple.  Skin:    General: Skin is warm.     Capillary Refill: Capillary refill takes less than 2 seconds.  Neurological:     General: No focal deficit present.     Mental Status: She is alert and oriented to person, place, and time.  Psychiatric:        Mood and Affect: Mood normal.        Behavior: Behavior normal.        Thought Content: Thought content normal.        Judgment: Judgment normal.     ED Results / Procedures / Treatments   Labs (all labs ordered are listed, but only abnormal results are displayed) Labs Reviewed   WET PREP, GENITAL - Abnormal; Notable for the following components:      Result Value   WBC, Wet Prep HPF POC MANY (*)    All other components within normal limits  URINALYSIS, ROUTINE W REFLEX MICROSCOPIC - Abnormal; Notable for the following components:   APPearance CLOUDY (*)    Specific Gravity, Urine >1.030 (*)    All other components within normal limits  PREGNANCY, URINE  GC/CHLAMYDIA PROBE AMP ([place]) NOT AT [facility]    EKG None  Radiology No results found.  Procedures Procedures (including critical care time)  Medications Ordered in ED Medications  fluconazole (DIFLUCAN) tablet 150 mg (has no administration in time range)    ED Course  I have reviewed the triage vital signs and the nursing notes.  Pertinent labs & imaging results that were available during my care of the patient were reviewed by me and considered in my medical decision making (see chart for details).  Wet prep did not show yeast or BV, but her vaginal exam looked like candidiasis.  Pt will be tx'd with diflucan.  She knows to return if worse.  F/u with pcp.    MDM Rules/Calculators/A&P                       Final Clinical Impression(s) / ED Diagnoses Final diagnoses:  Candidiasis of vagina    Rx / DC Orders ED Discharge Orders         Ordered    miconazole (MONISTAT 1 COMBO PACK) kit   Once     10/13/19 1000           Isla Pence, MD 10/13/19 1002

## 2019-10-13 NOTE — ED Triage Notes (Addendum)
Pt has hx of BV and yeast infections. Vaginal irritation started 3 days ago. Last sexually active a week ago.

## 2019-10-15 LAB — GC/CHLAMYDIA PROBE AMP (~~LOC~~) NOT AT ARMC
Chlamydia: NEGATIVE
Neisseria Gonorrhea: NEGATIVE

## 2019-11-23 ENCOUNTER — Encounter (HOSPITAL_BASED_OUTPATIENT_CLINIC_OR_DEPARTMENT_OTHER): Payer: Self-pay | Admitting: Emergency Medicine

## 2019-11-23 ENCOUNTER — Emergency Department (HOSPITAL_BASED_OUTPATIENT_CLINIC_OR_DEPARTMENT_OTHER)
Admission: EM | Admit: 2019-11-23 | Discharge: 2019-11-23 | Disposition: A | Discharge status: Home / Self Care | Payer: Medicaid Other | Attending: Emergency Medicine | Admitting: Emergency Medicine

## 2019-11-23 ENCOUNTER — Other Ambulatory Visit: Payer: Self-pay

## 2019-11-23 DIAGNOSIS — Z87891 Personal history of nicotine dependence: Secondary | ICD-10-CM | POA: Insufficient documentation

## 2019-11-23 DIAGNOSIS — R112 Nausea with vomiting, unspecified: Secondary | ICD-10-CM | POA: Diagnosis present

## 2019-11-23 DIAGNOSIS — F10920 Alcohol use, unspecified with intoxication, uncomplicated: Secondary | ICD-10-CM | POA: Insufficient documentation

## 2019-11-23 DIAGNOSIS — Z79899 Other long term (current) drug therapy: Secondary | ICD-10-CM | POA: Diagnosis not present

## 2019-11-23 NOTE — ED Provider Notes (Signed)
[facility] EMERGENCY DEPARTMENT Provider Note  CSN: [id number] Arrival date & time: 11/23/19 0241  Chief Complaint(s) Alcohol Intoxication and Headache  HPI Breanna Clark is a 24 y.o. female who presents to the emergency department for nausea and nonbloody nonbilious emesis and headache.  Patient endorses heavy alcohol consumption overall at the club.  She does not remember how much she drank.  Reports that she began feeling nauseated and had several bouts of emesis followed by dizziness and headache.  She denies any suspicious food intake.  No abdominal pain.  No chest pain or shortness of breath.  Currently her symptoms have subsided.  She last threw up 1 hour ago.  She no longer has a headache.  Any other physical complaints.  HPI  Past Medical History History reviewed. No pertinent past medical history. There are no problems to display for this patient.  Home Medication(s) Prior to Admission medications   Medication Sig Start Date End Date Taking? Authorizing Provider  benzonatate (TESSALON) 100 MG capsule Take 1 capsule (100 mg total) by mouth every 8 (eight) hours. 09/19/18   Petrucelli, Samantha R, PA-C  EPINEPHrine 0.3 mg/0.3 mL IJ SOAJ injection Inject 0.3 mLs (0.3 mg total) into the muscle as needed for anaphylaxis. 06/26/19   Raeford Razor, MD  fluticasone (FLONASE) 50 MCG/ACT nasal spray Place 1 spray into both nostrils daily. 09/19/18   Petrucelli, Samantha R, PA-C  ibuprofen (ADVIL,MOTRIN) 800 MG tablet Take 1 tablet (800 mg total) by mouth every 8 (eight) hours as needed. 09/19/18   Petrucelli, Pleas Koch, PA-C                                                                                                                                    Past Surgical History Past Surgical History:  Procedure Laterality Date  . CARDIAC SURGERY     Family History History reviewed. No pertinent family history.  Social History Social History   Tobacco Use  . Smoking  status: Former Games developer  . Smokeless tobacco: Never Used  Substance Use Topics  . Alcohol use: Yes    Comment: occ  . Drug use: No   Allergies Dust mite extract, Grass extracts [gramineae pollens], and Shrimp [shellfish allergy]  Review of Systems Review of Systems All other systems are reviewed and are negative for acute change except as noted in the HPI  Physical Exam Vital Signs  I have reviewed the triage vital signs BP 120/83 (BP Location: Right Arm)   Pulse (!) 102   Temp 98.5 F (36.9 C) (Oral)   Resp 20   Ht 5' (1.524 m)   Wt 63.5 kg   SpO2 100%   BMI 27.34 kg/m   Physical Exam Vitals reviewed.  Constitutional:      General: She is not in acute distress.    Appearance: She is well-developed. She is not diaphoretic.  HENT:     Head: Normocephalic and  atraumatic.     Right Ear: External ear normal.     Left Ear: External ear normal.     Nose: Nose normal.  Eyes:     General: No scleral icterus.    Conjunctiva/sclera: Conjunctivae normal.  Neck:     Trachea: Phonation normal.  Cardiovascular:     Rate and Rhythm: Normal rate and regular rhythm.  Pulmonary:     Effort: Pulmonary effort is normal. No respiratory distress.     Breath sounds: No stridor.  Abdominal:     General: There is no distension.     Tenderness: There is no abdominal tenderness.  Musculoskeletal:        General: Normal range of motion.     Cervical back: Normal range of motion.  Neurological:     Mental Status: She is alert and oriented to person, place, and time.     Motor: Motor function is intact.  Psychiatric:        Behavior: Behavior normal.     ED Results and Treatments Labs (all labs ordered are listed, but only abnormal results are displayed) Labs Reviewed - No data to display                                                                                                                       EKG  EKG Interpretation  Date/Time:    Ventricular Rate:    PR Interval:     QRS Duration:   QT Interval:    QTC Calculation:   R Axis:     Text Interpretation:        Radiology No results found.  Pertinent labs & imaging results that were available during my care of the patient were reviewed by me and considered in my medical decision making (see chart for details).  Medications Ordered in ED Medications - No data to display                                                                                                                                  Procedures Procedures  (including critical care time)  Medical Decision Making / ED Course I have reviewed the nursing notes for this encounter and the patient's prior records (if available in EHR or on provided paperwork).   Breanna Clark was evaluated in Emergency Department on 11/23/2019 for the symptoms described in the history of present illness. She was evaluated in the  context of the global COVID-19 pandemic, which necessitated consideration that the patient might be at risk for infection with the SARS-CoV-2 virus that causes COVID-19. Institutional protocols and algorithms that pertain to the evaluation of patients at risk for COVID-19 are in a state of rapid change based on information released by regulatory bodies including the CDC and federal and state organizations. These policies and algorithms were followed during the patient's care in the ED.  Uncomplicated alcohol intoxication resulting in vomiting.  Patient is currently asymptomatic.  Abdomen benign.  She is mildly intoxicated but coherent and cooperative.  Ambulates without complication. Stable vital signs.  She is here with friends.  No need for emergent work-up.       Final Clinical Impression(s) / ED Diagnoses Final diagnoses:  Alcoholic intoxication without complication (HCC)   The patient appears reasonably screened and/or stabilized for discharge and I doubt any other medical condition or other [facility] requiring further screening,  evaluation, or treatment in the ED at this time prior to discharge. Safe for discharge with strict return precautions.  Disposition: Discharge  Condition: Good  I have discussed the results, Dx and Tx plan with the patient/family who expressed understanding and agree(s) with the plan. Discharge instructions discussed at length. The patient/family was given strict return precautions who verbalized understanding of the instructions. No further questions at time of discharge.    ED Discharge Orders    None       Follow Up: Inc, [facility] [address] [place] [address] [phone]         This chart was dictated using voice recognition software.  Despite best efforts to proofread,  errors can occur which can change the documentation meaning.   Nira Conn, MD 11/23/19 [phone]

## 2019-11-23 NOTE — ED Triage Notes (Addendum)
  Patient comes in with headache, N/V, and dizziness from alcohol intoxication.  Patient states she was at the club with some friends and has had several drinks.  States about an hour ago she started throwing up and getting dizzy.  Unable to tell how many drinks she actually had but says she does not drink often.  Patient A&O x4, ambulated well to room.  Patient states she does not feel like she was drugged.  Was in control of her drinks at all times but doesn't drink often.

## 2020-01-15 ENCOUNTER — Encounter (HOSPITAL_BASED_OUTPATIENT_CLINIC_OR_DEPARTMENT_OTHER): Payer: Self-pay | Admitting: *Deleted

## 2020-01-15 ENCOUNTER — Other Ambulatory Visit: Payer: Self-pay

## 2020-01-15 ENCOUNTER — Emergency Department (HOSPITAL_BASED_OUTPATIENT_CLINIC_OR_DEPARTMENT_OTHER)
Admission: EM | Admit: 2020-01-15 | Discharge: 2020-01-15 | Disposition: A | Discharge status: Home / Self Care | Payer: Medicaid Other | Attending: Emergency Medicine | Admitting: Emergency Medicine

## 2020-01-15 DIAGNOSIS — N760 Acute vaginitis: Secondary | ICD-10-CM

## 2020-01-15 DIAGNOSIS — N899 Noninflammatory disorder of vagina, unspecified: Secondary | ICD-10-CM | POA: Diagnosis present

## 2020-01-15 DIAGNOSIS — Z91013 Allergy to seafood: Secondary | ICD-10-CM | POA: Insufficient documentation

## 2020-01-15 DIAGNOSIS — Z79899 Other long term (current) drug therapy: Secondary | ICD-10-CM | POA: Insufficient documentation

## 2020-01-15 LAB — URINALYSIS, ROUTINE W REFLEX MICROSCOPIC
Bilirubin Urine: NEGATIVE
Glucose, UA: NEGATIVE mg/dL
Ketones, ur: NEGATIVE mg/dL
Leukocytes,Ua: NEGATIVE
Nitrite: NEGATIVE
Protein, ur: NEGATIVE mg/dL
Specific Gravity, Urine: >1.03 — ABNORMAL HIGH (ref 1.005–1.030)
pH: 6 (ref 5.0–8.0)

## 2020-01-15 LAB — WET PREP, GENITAL
Sperm: NONE SEEN
Trich, Wet Prep: NONE SEEN
Yeast Wet Prep HPF POC: NONE SEEN

## 2020-01-15 LAB — URINALYSIS, MICROSCOPIC (REFLEX)

## 2020-01-15 LAB — HIV ANTIBODY (ROUTINE TESTING W REFLEX): HIV Screen 4th Generation wRfx: NONREACTIVE

## 2020-01-15 LAB — PREGNANCY, URINE: Preg Test, Ur: NEGATIVE

## 2020-01-15 MED ORDER — FLUCONAZOLE 150 MG PO TABS: 150.0000 mg | ORAL_TABLET | Freq: Once | ORAL | 0 refills | Status: AC

## 2020-01-15 MED ORDER — METRONIDAZOLE 500 MG PO TABS: 500.0000 mg | ORAL_TABLET | Freq: Two times a day (BID) | ORAL | 0 refills | Status: DC

## 2020-01-15 NOTE — ED Triage Notes (Signed)
Foul-smelling vaginal discharges for 4 days.  Slight burning sensation during urination.

## 2020-01-15 NOTE — Discharge Instructions (Signed)
Take Flagyl twice daily for one week. Take with food and do not drink alcohol while taking this medicine

## 2020-01-15 NOTE — ED Provider Notes (Signed)
[facility] EMERGENCY DEPARTMENT Provider Note   CSN: [id number] Arrival date & time: 01/15/20  4709     History Chief Complaint  Patient presents with  . Vaginal discharges    Breanna Clark is a 24 y.o. female who presents with vaginal discharge and dysuria.  Patient states that for the past 4 days she has had an odor from the vaginal area as well as whitish-gray vaginal discharge.  She is sexually active with the same partner and uses condoms.  She also reports some burning with urination but denies frequency, urgency, hematuria.  She denies any pelvic or flank pain, nausea or vomiting.  She denies concern for pregnancy.  Her last menstrual period was 1 month ago  HPI     History reviewed. No pertinent past medical history.  There are no problems to display for this patient.   Past Surgical History:  Procedure Laterality Date  . CARDIAC SURGERY     at birth     OB History    Gravida  2   Para  1   Term      Preterm      AB  1   Living        SAB      TAB      Ectopic      Multiple      Live Births              No family history on file.  Social History   Tobacco Use  . Smoking status: Never Smoker  . Smokeless tobacco: Never Used  Substance Use Topics  . Alcohol use: Yes    Comment: occ  . Drug use: No    Home Medications Prior to Admission medications   Medication Sig Start Date End Date Taking? Authorizing Provider  cetirizine (ZYRTEC) 10 MG tablet Take 10 mg by mouth daily.   Yes [provider]  EPINEPHrine 0.3 mg/0.3 mL IJ SOAJ injection Inject 0.3 mLs (0.3 mg total) into the muscle as needed for anaphylaxis. 06/26/19   Virgel Manifold, MD    Allergies    Dust mite extract, Grass extracts [gramineae pollens], and Shrimp [shellfish allergy]  Review of Systems   Review of Systems  Gastrointestinal: Negative for abdominal pain, nausea and vomiting.  Genitourinary: Positive for dysuria and vaginal discharge.  Negative for difficulty urinating, dyspareunia, flank pain, frequency, hematuria, menstrual problem, pelvic pain, vaginal bleeding and vaginal pain.  All other systems reviewed and are negative.   Physical Exam Updated Vital Signs BP 117/81 (BP Location: Right Arm)   Pulse 83   Temp 98.5 F (36.9 C) (Oral)   Resp 16   Ht 5' (1.524 m)   Wt 68.5 kg   LMP 12/17/2019   SpO2 100%   BMI 29.49 kg/m   Physical Exam Vitals and nursing note reviewed.  Constitutional:      General: She is not in acute distress.    Appearance: Normal appearance. She is well-developed. She is not ill-appearing.  HENT:     Head: Normocephalic and atraumatic.  Eyes:     General: No scleral icterus.       Right eye: No discharge.        Left eye: No discharge.     Conjunctiva/sclera: Conjunctivae normal.     Pupils: Pupils are equal, round, and reactive to light.  Cardiovascular:     Rate and Rhythm: Normal rate.  Pulmonary:     Effort: Pulmonary effort is  normal. No respiratory distress.  Abdominal:     General: There is no distension.  Genitourinary:    Comments: Pelvic: No inguinal lymphadenopathy or inguinal hernia noted. Normal external genitalia. No pain with speculum insertion. Closed cervical os with normal appearance - no rash or lesions. No significant discharge or bleeding noted from cervix or in vaginal vault. On bimanual examination no adnexal tenderness or cervical motion tenderness. Chaperone present during exam.   Musculoskeletal:     Cervical back: Normal range of motion.  Skin:    General: Skin is warm and dry.  Neurological:     Mental Status: She is alert and oriented to person, place, and time.  Psychiatric:        Behavior: Behavior normal.     ED Results / Procedures / Treatments   Labs (all labs ordered are listed, but only abnormal results are displayed) Labs Reviewed  WET PREP, GENITAL - Abnormal; Notable for the following components:      Result Value   Clue Cells  Wet Prep HPF POC PRESENT (*)    WBC, Wet Prep HPF POC FEW (*)    All other components within normal limits  URINALYSIS, ROUTINE W REFLEX MICROSCOPIC - Abnormal; Notable for the following components:   Specific Gravity, Urine >1.030 (*)    Hgb urine dipstick MODERATE (*)    All other components within normal limits  URINALYSIS, MICROSCOPIC (REFLEX) - Abnormal; Notable for the following components:   Bacteria, UA FEW (*)    All other components within normal limits  PREGNANCY, URINE  RPR  HIV ANTIBODY (ROUTINE TESTING W REFLEX)  GC/CHLAMYDIA PROBE AMP ([facility]) NOT AT [facility]    EKG None  Radiology No results found.  Procedures Procedures (including critical care time)  Medications Ordered in ED Medications - No data to display  ED Course  I have reviewed the triage vital signs and the nursing notes.  Pertinent labs & imaging results that were available during my care of the patient were reviewed by me and considered in my medical decision making (see chart for details).  24 year old presents with vaginal discharge and dysuria for the past 4 days.  Her vital signs are normal.  She is well-appearing.  Abdomen is soft and nontender.  Her pelvic exam is unremarkable.  Urinalysis has moderate hemoglobin and high specific gravity but otherwise no signs of infection.  Wet prep is positive for clue cells so we will treat for bacterial vaginosis.  Gonorrhea, chlamydia, HIV and syphilis were sent off.  Due to complaints of dysuria we will send off a urine culture however will hold off on treatment for UTI since symptoms may be just from bacterial vaginosis.  She is also requesting prescription for Diflucan since she regularly gets yeast infections after antibiotic use which was sent in as well.  MDM Rules/Calculators/A&P                       Final Clinical Impression(s) / ED Diagnoses Final diagnoses:  Bacterial vaginosis    Rx / DC Orders ED Discharge Orders    None         Bethel Born, PA-C 01/15/20 1103    Alvira Monday, MD 01/15/20 2239

## 2020-01-16 LAB — URINE CULTURE: Culture: <10000 — AB

## 2020-01-16 LAB — RPR: RPR Ser Ql: NONREACTIVE

## 2020-01-17 LAB — GC/CHLAMYDIA PROBE AMP (~~LOC~~) NOT AT ARMC
Chlamydia: NEGATIVE
Comment: NEGATIVE
Comment: NORMAL
Neisseria Gonorrhea: NEGATIVE

## 2020-03-19 ENCOUNTER — Encounter (HOSPITAL_BASED_OUTPATIENT_CLINIC_OR_DEPARTMENT_OTHER): Payer: Self-pay | Admitting: Emergency Medicine

## 2020-03-19 ENCOUNTER — Other Ambulatory Visit: Payer: Self-pay

## 2020-03-19 ENCOUNTER — Emergency Department (HOSPITAL_BASED_OUTPATIENT_CLINIC_OR_DEPARTMENT_OTHER)
Admission: EM | Admit: 2020-03-19 | Discharge: 2020-03-19 | Disposition: A | Discharge status: Home / Self Care | Payer: Medicaid Other | Attending: Emergency Medicine | Admitting: Emergency Medicine

## 2020-03-19 DIAGNOSIS — O219 Vomiting of pregnancy, unspecified: Secondary | ICD-10-CM | POA: Insufficient documentation

## 2020-03-19 DIAGNOSIS — Z3A00 Weeks of gestation of pregnancy not specified: Secondary | ICD-10-CM | POA: Insufficient documentation

## 2020-03-19 LAB — URINALYSIS, ROUTINE W REFLEX MICROSCOPIC
Bilirubin Urine: NEGATIVE
Glucose, UA: NEGATIVE mg/dL
Hgb urine dipstick: NEGATIVE
Ketones, ur: NEGATIVE mg/dL
Leukocytes,Ua: NEGATIVE
Nitrite: NEGATIVE
Protein, ur: 30 mg/dL — AB
Specific Gravity, Urine: 1.02 (ref 1.005–1.030)
pH: 7.5 (ref 5.0–8.0)

## 2020-03-19 LAB — URINALYSIS, MICROSCOPIC (REFLEX)

## 2020-03-19 LAB — PREGNANCY, URINE: Preg Test, Ur: POSITIVE — AB

## 2020-03-19 MED ORDER — ONDANSETRON 4 MG PO TBDP
4.0000 mg | ORAL_TABLET | Freq: Once | ORAL | Status: AC
  Administered 2020-03-19: 4 mg via ORAL
  Filled 2020-03-19: qty 1

## 2020-03-19 MED ORDER — ONDANSETRON 4 MG PO TBDP: 4.0000 mg | ORAL_TABLET | Freq: Three times a day (TID) | ORAL | 0 refills | Status: AC | PRN

## 2020-03-19 NOTE — ED Provider Notes (Signed)
[facility] EMERGENCY DEPARTMENT Provider Note  CSN: [id number] Arrival date & time: 03/19/20 0913    History Chief Complaint  Patient presents with  . Emesis    HPI  Breanna Clark is a 24 y.o. female with no significant PMH reports she has had 2 days of occasional vomiting and is 4 days late for her menstrual cycle. She is G2P2 has not checked a pregnancy test recently. She reports mild abdominal discomfort 'all over' but no specific pelvic pain. She has not had any dysuria or vaginal discharge.    History reviewed. No pertinent past medical history.  Past Surgical History:  Procedure Laterality Date  . CARDIAC SURGERY     at birth    No family history on file.  Social History   Tobacco Use  . Smoking status: Never Smoker  . Smokeless tobacco: Never Used  Vaping Use  . Vaping Use: Never used  Substance Use Topics  . Alcohol use: Yes    Comment: occ  . Drug use: No     Home Medications Prior to Admission medications   Medication Sig Start Date End Date Taking? Authorizing Provider  cetirizine (ZYRTEC) 10 MG tablet Take 10 mg by mouth daily.   Yes [provider]  EPINEPHrine 0.3 mg/0.3 mL IJ SOAJ injection Inject 0.3 mLs (0.3 mg total) into the muscle as needed for anaphylaxis. 06/26/19   Raeford Razor, MD  ondansetron (ZOFRAN ODT) 4 MG disintegrating tablet Take 1 tablet (4 mg total) by mouth every 8 (eight) hours as needed for nausea or vomiting. 03/19/20   Pollyann Savoy, MD     Allergies    Dust mite extract, Grass extracts [gramineae pollens], and Shrimp [shellfish allergy]   Review of Systems   Review of Systems A comprehensive review of systems was completed and negative except as noted in HPI.    Physical Exam BP 118/83 (BP Location: Right Arm)   Pulse 94   Temp 98.9 F (37.2 C)   Resp 14   LMP 02/11/2020   SpO2 100%   Physical Exam Vitals and nursing note reviewed.  Constitutional:      Appearance: Normal  appearance.  HENT:     Head: Normocephalic and atraumatic.     Nose: Nose normal.     Mouth/Throat:     Mouth: Mucous membranes are moist.  Eyes:     Extraocular Movements: Extraocular movements intact.     Conjunctiva/sclera: Conjunctivae normal.  Cardiovascular:     Rate and Rhythm: Normal rate.  Pulmonary:     Effort: Pulmonary effort is normal.     Breath sounds: Normal breath sounds.  Abdominal:     General: Abdomen is flat.     Palpations: Abdomen is soft.     Tenderness: There is no abdominal tenderness. There is no guarding.  Musculoskeletal:        General: No swelling. Normal range of motion.     Cervical back: Neck supple.  Skin:    General: Skin is warm and dry.  Neurological:     General: No focal deficit present.     Mental Status: She is alert.  Psychiatric:        Mood and Affect: Mood normal.      ED Results / Procedures / Treatments   Labs (all labs ordered are listed, but only abnormal results are displayed) Labs Reviewed  URINALYSIS, ROUTINE W REFLEX MICROSCOPIC - Abnormal; Notable for the following components:  Result Value   APPearance CLOUDY (*)    Protein, ur 30 (*)    All other components within normal limits  PREGNANCY, URINE - Abnormal; Notable for the following components:   Preg Test, Ur POSITIVE (*)    All other components within normal limits  URINALYSIS, MICROSCOPIC (REFLEX) - Abnormal; Notable for the following components:   Bacteria, UA MANY (*)    All other components within normal limits    EKG None   Radiology No results found.  Procedures Procedures  Medications Ordered in the ED Medications  ondansetron (ZOFRAN-ODT) disintegrating tablet 4 mg (has no administration in time range)     MDM Rules/Calculators/A&P MDM Will check HCG. No symptoms concerning for early pregnancy complications such as ectopic.  ED Course  I have reviewed the triage vital signs and the nursing notes.  Pertinent labs & imaging  results that were available during my care of the patient were reviewed by me and considered in my medical decision making (see chart for details).  Clinical Course as of Mar 19 1033  Thu Mar 19, 2020  1017 Pregnancy is positive. UA contaminated, similar to previous that did not have a positive culture.    [CS]  1033 Patient reports she will be going to [facility] for her prenatal care. Rx for Zofran for nausea. RTED for any other concerns.   [CS]    Clinical Course User Index [CS] Pollyann Savoy, MD    Final Clinical Impression(s) / ED Diagnoses Final diagnoses:  Nausea and vomiting in pregnancy    Rx / DC Orders ED Discharge Orders         Ordered    ondansetron (ZOFRAN ODT) 4 MG disintegrating tablet  Every 8 hours PRN     Discontinue  Reprint     03/19/20 1033           Pollyann Savoy, MD 03/19/20 1034

## 2020-03-19 NOTE — ED Notes (Signed)
Ginger Ale given  

## 2020-03-19 NOTE — ED Triage Notes (Addendum)
Vomiting x 2 days, generalized abd pain. Denies diarrhea. She thinks that she is pregnant.

## 2020-03-23 ENCOUNTER — Encounter (HOSPITAL_BASED_OUTPATIENT_CLINIC_OR_DEPARTMENT_OTHER): Payer: Self-pay | Admitting: *Deleted

## 2020-03-23 ENCOUNTER — Other Ambulatory Visit: Payer: Self-pay

## 2020-03-23 ENCOUNTER — Emergency Department (HOSPITAL_BASED_OUTPATIENT_CLINIC_OR_DEPARTMENT_OTHER)
Admission: EM | Admit: 2020-03-23 | Discharge: 2020-03-23 | Disposition: A | Discharge status: Home / Self Care | Payer: Medicaid Other | Attending: Emergency Medicine | Admitting: Emergency Medicine

## 2020-03-23 DIAGNOSIS — O219 Vomiting of pregnancy, unspecified: Secondary | ICD-10-CM | POA: Insufficient documentation

## 2020-03-23 DIAGNOSIS — R42 Dizziness and giddiness: Secondary | ICD-10-CM | POA: Diagnosis not present

## 2020-03-23 DIAGNOSIS — O2390 Unspecified genitourinary tract infection in pregnancy, unspecified trimester: Secondary | ICD-10-CM | POA: Insufficient documentation

## 2020-03-23 DIAGNOSIS — R112 Nausea with vomiting, unspecified: Secondary | ICD-10-CM

## 2020-03-23 DIAGNOSIS — Z3490 Encounter for supervision of normal pregnancy, unspecified, unspecified trimester: Secondary | ICD-10-CM

## 2020-03-23 DIAGNOSIS — Z3A00 Weeks of gestation of pregnancy not specified: Secondary | ICD-10-CM | POA: Insufficient documentation

## 2020-03-23 DIAGNOSIS — R8271 Bacteriuria: Secondary | ICD-10-CM

## 2020-03-23 DIAGNOSIS — O99891 Other specified diseases and conditions complicating pregnancy: Secondary | ICD-10-CM | POA: Diagnosis present

## 2020-03-23 LAB — URINALYSIS, MICROSCOPIC (REFLEX): WBC, UA: NONE SEEN WBC/hpf (ref 0–5)

## 2020-03-23 LAB — BASIC METABOLIC PANEL
Anion gap: 12 (ref 5–15)
BUN: 10 mg/dL (ref 6–20)
CO2: 26 mmol/L (ref 22–32)
Calcium: 8.8 mg/dL — ABNORMAL LOW (ref 8.9–10.3)
Chloride: 100 mmol/L (ref 98–111)
Creatinine, Ser: 0.76 mg/dL (ref 0.44–1.00)
GFR calc Af Amer: >60 mL/min (ref >60)
GFR calc non Af Amer: >60 mL/min (ref >60)
Glucose, Bld: 111 mg/dL — ABNORMAL HIGH (ref 70–99)
Potassium: 3.3 mmol/L — ABNORMAL LOW (ref 3.5–5.1)
Sodium: 138 mmol/L (ref 135–145)

## 2020-03-23 LAB — CBC
HCT: 35.2 % — ABNORMAL LOW (ref 36.0–46.0)
Hemoglobin: 11.4 g/dL — ABNORMAL LOW (ref 12.0–15.0)
MCH: 24.5 pg — ABNORMAL LOW (ref 26.0–34.0)
MCHC: 32.4 g/dL (ref 30.0–36.0)
MCV: 75.5 fL — ABNORMAL LOW (ref 80.0–100.0)
Platelets: 347 10*3/uL (ref 150–400)
RBC: 4.66 MIL/uL (ref 3.87–5.11)
RDW: 15 % (ref 11.5–15.5)
WBC: 8.3 10*3/uL (ref 4.0–10.5)
nRBC: 0 % (ref 0.0–0.2)

## 2020-03-23 LAB — URINALYSIS, ROUTINE W REFLEX MICROSCOPIC
Bilirubin Urine: NEGATIVE
Glucose, UA: NEGATIVE mg/dL
Hgb urine dipstick: NEGATIVE
Ketones, ur: 15 mg/dL — AB
Leukocytes,Ua: NEGATIVE
Nitrite: NEGATIVE
Protein, ur: 100 mg/dL — AB
Specific Gravity, Urine: 1.025 (ref 1.005–1.030)
pH: 7 (ref 5.0–8.0)

## 2020-03-23 MED ORDER — CEPHALEXIN 500 MG PO CAPS: 500.0000 mg | ORAL_CAPSULE | Freq: Two times a day (BID) | ORAL | 0 refills | Status: DC

## 2020-03-23 MED ORDER — SODIUM CHLORIDE 0.9 % IV BOLUS
1000.0000 mL | Freq: Once | INTRAVENOUS | Status: AC
  Administered 2020-03-23: 1000 mL via INTRAVENOUS

## 2020-03-23 MED ORDER — PROMETHAZINE HCL 25 MG PO TABS: 25.0000 mg | ORAL_TABLET | Freq: Three times a day (TID) | ORAL | 0 refills | Status: DC | PRN

## 2020-03-23 NOTE — ED Provider Notes (Signed)
[facility] EMERGENCY DEPARTMENT Provider Note   CSN: [id number] Arrival date & time: 03/23/20  6063     History Chief Complaint  Patient presents with  . Heartburn    Breanna Clark is a 24 y.o. female.  HPI Patient presents with chest pain nausea vomiting and lightheadedness.  Last menses was the beginning of June.  Seen in the ER around 4 days ago and a positive pregnancy test.  Had nausea and vomiting at that time.  No fevers.  States however after vomiting now she has had a little bit of brown to red to the vomitus.  States she also felt lightheaded and feels little lightheaded when she stands up.  No abdominal pain.  Mild dull chest pain.  States she has had a history of anemia in the past.  No dysuria.    History reviewed. No pertinent past medical history.  There are no problems to display for this patient.   Past Surgical History:  Procedure Laterality Date  . CARDIAC SURGERY     at birth     OB History    Gravida  2   Para  1   Term      Preterm      AB  1   Living        SAB      TAB      Ectopic      Multiple      Live Births              History reviewed. No pertinent family history.  Social History   Tobacco Use  . Smoking status: Never Smoker  . Smokeless tobacco: Never Used  Vaping Use  . Vaping Use: Never used  Substance Use Topics  . Alcohol use: Yes    Comment: occ  . Drug use: No    Home Medications Prior to Admission medications   Medication Sig Start Date End Date Taking? Authorizing Provider  cephALEXin (KEFLEX) 500 MG capsule Take 1 capsule (500 mg total) by mouth 2 (two) times daily. 03/23/20   Benjiman Core, MD  cetirizine (ZYRTEC) 10 MG tablet Take 10 mg by mouth daily.    [provider]  EPINEPHrine 0.3 mg/0.3 mL IJ SOAJ injection Inject 0.3 mLs (0.3 mg total) into the muscle as needed for anaphylaxis. 06/26/19   Raeford Razor, MD  ondansetron (ZOFRAN ODT) 4 MG disintegrating tablet  Take 1 tablet (4 mg total) by mouth every 8 (eight) hours as needed for nausea or vomiting. 03/19/20   Pollyann Savoy, MD  promethazine (PHENERGAN) 25 MG tablet Take 1 tablet (25 mg total) by mouth every 8 (eight) hours as needed for nausea. 03/23/20   Benjiman Core, MD    Allergies    Dust mite extract, Grass extracts [gramineae pollens], and Shrimp [shellfish allergy]  Review of Systems   Review of Systems  Constitutional: Positive for appetite change.  Cardiovascular: Positive for chest pain.  Gastrointestinal: Positive for nausea and vomiting. Negative for abdominal pain.  Genitourinary: Negative for vaginal bleeding and vaginal discharge.  Musculoskeletal: Negative for gait problem.  Skin: Negative for rash.  Neurological: Positive for light-headedness.  Psychiatric/Behavioral: Negative for confusion.  All other systems reviewed and are negative.   Physical Exam Updated Vital Signs BP 111/79   Pulse 91   Temp 98.5 F (36.9 C) (Oral)   Resp 16   Ht 5' (1.524 m)   Wt 67.1 kg   LMP 02/11/2020   SpO2  100%   BMI 28.90 kg/m   Physical Exam Vitals reviewed.  HENT:     Head: Normocephalic.  Eyes:     General: No scleral icterus.    Pupils: Pupils are equal, round, and reactive to light.  Cardiovascular:     Rate and Rhythm: Regular rhythm.  Pulmonary:     Breath sounds: No wheezing or rhonchi.  Abdominal:     Tenderness: There is no abdominal tenderness.  Musculoskeletal:        General: No tenderness.     Cervical back: Neck supple.  Skin:    General: Skin is warm.     Capillary Refill: Capillary refill takes less than 2 seconds.  Neurological:     Mental Status: She is alert and oriented to person, place, and time.     ED Results / Procedures / Treatments   Labs (all labs ordered are listed, but only abnormal results are displayed) Labs Reviewed  URINALYSIS, ROUTINE W REFLEX MICROSCOPIC - Abnormal; Notable for the following components:      Result  Value   APPearance CLOUDY (*)    Ketones, ur 15 (*)    Protein, ur 100 (*)    All other components within normal limits  CBC - Abnormal; Notable for the following components:   Hemoglobin 11.4 (*)    HCT 35.2 (*)    MCV 75.5 (*)    MCH 24.5 (*)    All other components within normal limits  BASIC METABOLIC PANEL - Abnormal; Notable for the following components:   Potassium 3.3 (*)    Glucose, Bld 111 (*)    Calcium 8.8 (*)    All other components within normal limits  URINALYSIS, MICROSCOPIC (REFLEX) - Abnormal; Notable for the following components:   Bacteria, UA RARE (*)    All other components within normal limits    EKG None  Radiology No results found.  Procedures Procedures (including critical care time)  Medications Ordered in ED Medications  sodium chloride 0.9 % bolus 1,000 mL ( Intravenous Stopped 03/23/20 2022)    ED Course  I have reviewed the triage vital signs and the nursing notes.  Pertinent labs & imaging results that were available during my care of the patient were reviewed by me and considered in my medical decision making (see chart for details).    MDM Rules/Calculators/A&P                          Patient presents with nausea vomiting and some chest pain.  Pregnant.  No relief with Zofran.  States she also has nasal congestion.  Lab work reassuring.  States she felt dizzy and has a mild anemia but hemoglobin is 11.  Will treat the bacteria in the urine since she is pregnant.  Outpatient follow-up with with her OB/GYN  final Clinical Impression(s) / ED Diagnoses Final diagnoses:  Pregnancy, unspecified gestational age  Bacteriuria  Nausea and vomiting, intractability of vomiting not specified, unspecified vomiting type    Rx / DC Orders ED Discharge Orders         Ordered    cephALEXin (KEFLEX) 500 MG capsule  2 times daily     Discontinue  Reprint     03/23/20 2147    promethazine (PHENERGAN) 25 MG tablet  Every 8 hours PRN     Discontinue   Reprint     03/23/20 2147           Satara Virella,  Harrold Donath, MD 03/23/20 2148

## 2020-03-23 NOTE — ED Triage Notes (Addendum)
Pt c/o preg with heartburn and vomiting x 12 hrs , pt did not call OBGYN, seen here 7/15 for same , LMP 7/8

## 2020-03-23 NOTE — ED Notes (Signed)
ED Provider at bedside. 

## 2020-03-23 NOTE — Discharge Instructions (Signed)
Take the antibiotics for the bacteria in the urine.  Claritin is okay to take in pregnancy.

## 2021-02-25 ENCOUNTER — Encounter (HOSPITAL_BASED_OUTPATIENT_CLINIC_OR_DEPARTMENT_OTHER): Payer: Self-pay | Admitting: *Deleted

## 2021-02-25 ENCOUNTER — Other Ambulatory Visit (HOSPITAL_BASED_OUTPATIENT_CLINIC_OR_DEPARTMENT_OTHER): Payer: Self-pay

## 2021-02-25 ENCOUNTER — Other Ambulatory Visit: Payer: Self-pay

## 2021-02-25 ENCOUNTER — Emergency Department (HOSPITAL_BASED_OUTPATIENT_CLINIC_OR_DEPARTMENT_OTHER)
Admission: EM | Admit: 2021-02-25 | Discharge: 2021-02-25 | Disposition: A | Discharge status: Home / Self Care | Payer: Medicaid Other | Attending: Emergency Medicine | Admitting: Emergency Medicine

## 2021-02-25 ENCOUNTER — Emergency Department (HOSPITAL_BASED_OUTPATIENT_CLINIC_OR_DEPARTMENT_OTHER): Payer: Medicaid Other

## 2021-02-25 DIAGNOSIS — D72829 Elevated white blood cell count, unspecified: Secondary | ICD-10-CM | POA: Diagnosis not present

## 2021-02-25 DIAGNOSIS — D649 Anemia, unspecified: Secondary | ICD-10-CM | POA: Diagnosis not present

## 2021-02-25 DIAGNOSIS — R102 Pelvic and perineal pain: Secondary | ICD-10-CM

## 2021-02-25 DIAGNOSIS — E871 Hypo-osmolality and hyponatremia: Secondary | ICD-10-CM | POA: Diagnosis not present

## 2021-02-25 DIAGNOSIS — R112 Nausea with vomiting, unspecified: Secondary | ICD-10-CM

## 2021-02-25 DIAGNOSIS — N390 Urinary tract infection, site not specified: Secondary | ICD-10-CM

## 2021-02-25 DIAGNOSIS — O219 Vomiting of pregnancy, unspecified: Secondary | ICD-10-CM | POA: Insufficient documentation

## 2021-02-25 DIAGNOSIS — Z3A01 Less than 8 weeks gestation of pregnancy: Secondary | ICD-10-CM | POA: Insufficient documentation

## 2021-02-25 DIAGNOSIS — Z20822 Contact with and (suspected) exposure to covid-19: Secondary | ICD-10-CM | POA: Diagnosis not present

## 2021-02-25 DIAGNOSIS — O2341 Unspecified infection of urinary tract in pregnancy, first trimester: Secondary | ICD-10-CM | POA: Diagnosis not present

## 2021-02-25 DIAGNOSIS — E876 Hypokalemia: Secondary | ICD-10-CM | POA: Insufficient documentation

## 2021-02-25 LAB — CBC WITH DIFFERENTIAL/PLATELET
Abs Immature Granulocytes: 0.01 10*3/uL (ref 0.00–0.07)
Basophils Absolute: 0 10*3/uL (ref 0.0–0.1)
Basophils Relative: 1 %
Eosinophils Absolute: 0.1 10*3/uL (ref 0.0–0.5)
Eosinophils Relative: 1 %
HCT: 36.5 % (ref 36.0–46.0)
Hemoglobin: 11.6 g/dL — ABNORMAL LOW (ref 12.0–15.0)
Immature Granulocytes: 0 %
Lymphocytes Relative: 23 %
Lymphs Abs: 1.4 10*3/uL (ref 0.7–4.0)
MCH: 23 pg — ABNORMAL LOW (ref 26.0–34.0)
MCHC: 31.8 g/dL (ref 30.0–36.0)
MCV: 72.3 fL — ABNORMAL LOW (ref 80.0–100.0)
Monocytes Absolute: 0.5 10*3/uL (ref 0.1–1.0)
Monocytes Relative: 8 %
Neutro Abs: 4.1 10*3/uL (ref 1.7–7.7)
Neutrophils Relative %: 67 %
Platelets: 327 10*3/uL (ref 150–400)
RBC: 5.05 MIL/uL (ref 3.87–5.11)
RDW: 17.4 % — ABNORMAL HIGH (ref 11.5–15.5)
WBC: 6 10*3/uL (ref 4.0–10.5)
nRBC: 0 % (ref 0.0–0.2)

## 2021-02-25 LAB — URINALYSIS, MICROSCOPIC (REFLEX)

## 2021-02-25 LAB — URINALYSIS, ROUTINE W REFLEX MICROSCOPIC
Bilirubin Urine: NEGATIVE
Glucose, UA: NEGATIVE mg/dL
Hgb urine dipstick: NEGATIVE
Ketones, ur: 15 mg/dL — AB
Nitrite: NEGATIVE
Protein, ur: 100 mg/dL — AB
Specific Gravity, Urine: 1.015 (ref 1.005–1.030)
pH: 8 (ref 5.0–8.0)

## 2021-02-25 LAB — WET PREP, GENITAL
Clue Cells Wet Prep HPF POC: NONE SEEN
Sperm: NONE SEEN
Trich, Wet Prep: NONE SEEN
Yeast Wet Prep HPF POC: NONE SEEN

## 2021-02-25 LAB — COMPREHENSIVE METABOLIC PANEL
ALT: 19 U/L (ref 0–44)
AST: 29 U/L (ref 15–41)
Albumin: 4.5 g/dL (ref 3.5–5.0)
Alkaline Phosphatase: 50 U/L (ref 38–126)
Anion gap: 8 (ref 5–15)
BUN: 9 mg/dL (ref 6–20)
CO2: 26 mmol/L (ref 22–32)
Calcium: 9.4 mg/dL (ref 8.9–10.3)
Chloride: 100 mmol/L (ref 98–111)
Creatinine, Ser: 0.64 mg/dL (ref 0.44–1.00)
GFR, Estimated: >60 mL/min (ref >60)
Glucose, Bld: 106 mg/dL — ABNORMAL HIGH (ref 70–99)
Potassium: 3.1 mmol/L — ABNORMAL LOW (ref 3.5–5.1)
Sodium: 134 mmol/L — ABNORMAL LOW (ref 135–145)
Total Bilirubin: 0.4 mg/dL (ref 0.3–1.2)
Total Protein: 8.2 g/dL — ABNORMAL HIGH (ref 6.5–8.1)

## 2021-02-25 LAB — GC/CHLAMYDIA PROBE AMP (~~LOC~~) NOT AT ARMC
Chlamydia: NEGATIVE
Comment: NEGATIVE
Comment: NORMAL
Neisseria Gonorrhea: NEGATIVE

## 2021-02-25 LAB — SARS CORONAVIRUS 2 (TAT 6-24 HRS): SARS Coronavirus 2: NEGATIVE

## 2021-02-25 LAB — LIPASE, BLOOD: Lipase: 32 U/L (ref 11–51)

## 2021-02-25 LAB — HCG, QUANTITATIVE, PREGNANCY: hCG, Beta Chain, Quant, S: 55367 m[IU]/mL — ABNORMAL HIGH (ref <5)

## 2021-02-25 MED ORDER — CEPHALEXIN 500 MG PO CAPS
500.0000 mg | ORAL_CAPSULE | Freq: Four times a day (QID) | ORAL | 0 refills | Status: AC
  Filled 2021-02-25: qty 28, 7d supply, fill #0

## 2021-02-25 MED ORDER — ACETAMINOPHEN 325 MG PO TABS
650.0000 mg | ORAL_TABLET | Freq: Once | ORAL | Status: AC
  Administered 2021-02-25: 650 mg via ORAL
  Filled 2021-02-25: qty 2

## 2021-02-25 MED ORDER — DOXYLAMINE-PYRIDOXINE 10-10 MG PO TBEC
DELAYED_RELEASE_TABLET | ORAL | 0 refills | Status: AC
  Filled 2021-02-25: qty 30, 8d supply, fill #0

## 2021-02-25 MED ORDER — SODIUM CHLORIDE 0.9 % IV BOLUS
1000.0000 mL | Freq: Once | INTRAVENOUS | Status: AC
  Administered 2021-02-25: 1000 mL via INTRAVENOUS

## 2021-02-25 MED ORDER — POTASSIUM CHLORIDE CRYS ER 20 MEQ PO TBCR
40.0000 meq | EXTENDED_RELEASE_TABLET | Freq: Once | ORAL | Status: AC
  Administered 2021-02-25: 40 meq via ORAL
  Filled 2021-02-25: qty 2

## 2021-02-25 MED ORDER — ONDANSETRON 4 MG PO TBDP
4.0000 mg | ORAL_TABLET | Freq: Once | ORAL | Status: AC
  Administered 2021-02-25: 4 mg via ORAL
  Filled 2021-02-25: qty 1

## 2021-02-25 NOTE — ED Provider Notes (Signed)
[facility] EMERGENCY DEPARTMENT Provider Note   CSN: [id number] Arrival date & time: 02/25/21  1049     History Chief Complaint  Patient presents with   Emesis    Breanna Clark is a 25 y.o. female.  HPI   Pt is a 25 y/o female, G3P1A1 who presents to the ED today for eval of nausea and vomiting that started last night  She further reports associated sweats and chills. She reports some diffuse abd cramping. Denies abnormal vaginal bleeding, vaginal discharge. She reports urinary frequency, but denies dysuria or hematuria.   History reviewed. No pertinent past medical history.  There are no problems to display for this patient.   Past Surgical History:  Procedure Laterality Date   CARDIAC SURGERY     at birth     OB History     Gravida  3   Para  1   Term      Preterm      AB  1   Living         SAB      IAB      Ectopic      Multiple      Live Births              No family history on file.  Social History   Tobacco Use   Smoking status: Never   Smokeless tobacco: Never  Vaping Use   Vaping Use: Never used  Substance Use Topics   Alcohol use: Not Currently    Comment: occ   Drug use: No    Home Medications Prior to Admission medications   Medication Sig Start Date End Date Taking? Authorizing Provider  cephALEXin (KEFLEX) 500 MG capsule Take 1 capsule (500 mg total) by mouth 4 (four) times daily for 7 days. 02/25/21 03/04/21 Yes Sahas Sluka S, PA-C  Doxylamine-Pyridoxine 10-10 MG TBEC Take two tablets at bedtime on day 1 and 2;  If symptoms persist, take 1 tablet in morning and 2 tablets at bedtime on day 3;  If symptoms persist, may increase to 1 tablet in morning, 1 tablet mid-afternoon, and 2 tablets at bedtime on day 4 (maximum: doxylamine 40 mg/pyridoxine 40 mg (4 tablets) per day). 02/25/21  Yes Celso Granja S, PA-C  EPINEPHrine 0.3 mg/0.3 mL IJ SOAJ injection Inject 0.3 mLs (0.3 mg total) into the muscle as  needed for anaphylaxis. 06/26/19   Raeford RazorKohut, Stephen, MD  ondansetron (ZOFRAN ODT) 4 MG disintegrating tablet Take 1 tablet (4 mg total) by mouth every 8 (eight) hours as needed for nausea or vomiting. 03/19/20   Pollyann SavoySheldon, Charles B, MD  promethazine (PHENERGAN) 25 MG tablet Take 1 tablet (25 mg total) by mouth every 8 (eight) hours as needed for nausea. 03/23/20   Benjiman CorePickering, Nathan, MD    Allergies    Dust mite extract, Grass extracts [gramineae pollens], and Shrimp [shellfish allergy]  Review of Systems   Review of Systems  Constitutional:  Positive for chills and diaphoresis. Negative for fever.  HENT:  Negative for ear pain and sore throat.   Eyes:  Negative for visual disturbance.  Respiratory:  Negative for cough and shortness of breath.   Cardiovascular:  Negative for chest pain.  Gastrointestinal:  Positive for abdominal pain, nausea and vomiting. Negative for constipation and diarrhea.  Genitourinary:  Positive for flank pain. Negative for dysuria and hematuria.  Musculoskeletal:  Negative for back pain.  Skin:  Negative for color change and rash.  Neurological:  Negative for headaches.  All other systems reviewed and are negative.  Physical Exam Updated Vital Signs BP 111/73   Pulse 99   Temp 99.2 F (37.3 C) (Oral)   Resp 20   Ht 5' (1.524 m)   Wt 64.3 kg   LMP 01/15/2021   SpO2 100%   BMI 27.69 kg/m   Physical Exam Vitals and nursing note reviewed.  Constitutional:      General: She is not in acute distress.    Appearance: She is well-developed.  HENT:     Head: Normocephalic and atraumatic.  Eyes:     Conjunctiva/sclera: Conjunctivae normal.  Cardiovascular:     Rate and Rhythm: Normal rate and regular rhythm.     Heart sounds: Normal heart sounds. No murmur heard. Pulmonary:     Effort: Pulmonary effort is normal. No respiratory distress.     Breath sounds: Normal breath sounds. No wheezing, rhonchi or rales.  Abdominal:     General: Bowel sounds are  normal.     Palpations: Abdomen is soft.     Tenderness: There is no abdominal tenderness. There is no guarding or rebound.  Genitourinary:    Comments: Exam performed by Karrie Meres,  exam chaperoned Date: 02/25/2021 Pelvic exam: normal external genitalia without evidence of trauma. VULVA: normal appearing vulva with no masses, tenderness or lesion. VAGINA: normal appearing vagina with normal color and discharge, no lesions. CERVIX: normal appearing cervix without lesions, cervical motion tenderness absent, cervical os closed with out purulent discharge; vaginal discharge - none, Wet prep and DNA probe for chlamydia and GC obtained.   ADNEXA: normal adnexa in size, nontender and no masses UTERUS: uterus is normal size, shape, consistency and nontender.   Musculoskeletal:     Cervical back: Neck supple.  Skin:    General: Skin is warm and dry.  Neurological:     Mental Status: She is alert.    ED Results / Procedures / Treatments   Labs (all labs ordered are listed, but only abnormal results are displayed) Labs Reviewed  WET PREP, GENITAL - Abnormal; Notable for the following components:      Result Value   WBC, Wet Prep HPF POC RARE (*)    All other components within normal limits  URINALYSIS, ROUTINE W REFLEX MICROSCOPIC - Abnormal; Notable for the following components:   APPearance CLOUDY (*)    Ketones, ur 15 (*)    Protein, ur 100 (*)    Leukocytes,Ua SMALL (*)    All other components within normal limits  CBC WITH DIFFERENTIAL/PLATELET - Abnormal; Notable for the following components:   Hemoglobin 11.6 (*)    MCV 72.3 (*)    MCH 23.0 (*)    RDW 17.4 (*)    All other components within normal limits  COMPREHENSIVE METABOLIC PANEL - Abnormal; Notable for the following components:   Sodium 134 (*)    Potassium 3.1 (*)    Glucose, Bld 106 (*)    Total Protein 8.2 (*)    All other components within normal limits  URINALYSIS, MICROSCOPIC (REFLEX) - Abnormal; Notable  for the following components:   Bacteria, UA MANY (*)    All other components within normal limits  HCG, QUANTITATIVE, PREGNANCY - Abnormal; Notable for the following components:   hCG, Beta Chain, Quant, S 55,367 (*)    All other components within normal limits  URINE CULTURE  SARS CORONAVIRUS 2 (TAT 6-24 HRS)  LIPASE, BLOOD  GC/CHLAMYDIA PROBE AMP ([place]) NOT  AT [facility]    EKG None  Radiology US OB LESS THAN 14 WEEKS WITH OB TRANSVAGINAL  Result Date: 02/25/2021 CLINICAL DATA:  Pelvic pain during first trimester of pregnancy, LMP 01/19/2021, quantitative beta HCG 55,367 EXAM: OBSTETRIC <14 WK [place] AND TRANSVAGINAL OB US TECHNIQUE: Both transabdominal and transvaginal ultrasound examinations were performed for complete evaluation of the gestation as well as the maternal uterus, adnexal regions, and pelvic cul-de-sac. Transvaginal technique was performed to assess early pregnancy. COMPARISON:  None FINDINGS: Intrauterine gestational sac: Present, single Yolk sac:  Present Embryo:  Present Cardiac Activity: Present Heart Rate: 118 bpm CRL:  5.1 mm   6 w   2 d                  [place] EDC: 10/18/2020 Subchorionic hemorrhage:  None visualized. Maternal uterus/adnexae: Uterus anteverted, otherwise unremarkable. LEFT ovary normal size and morphology, 3.0 x 1.5 x 1.7 cm. RIGHT ovary normal size and morphology, 3.9 x 2.9 x 2.3 cm. No free pelvic fluid or adnexal masses. IMPRESSION: Single live intrauterine gestation at 6 weeks 2 days EGA by crown-rump length. No acute abnormalities. Electronically Signed   By: Ulyses Southward M.D.   On: 02/25/2021 16:12    Procedures Procedures   Medications Ordered in ED Medications  acetaminophen (TYLENOL) tablet 650 mg (650 mg Oral Given 02/25/21 1132)  ondansetron (ZOFRAN-ODT) disintegrating tablet 4 mg (4 mg Oral Given 02/25/21 1216)  sodium chloride 0.9 % bolus 1,000 mL (0 mLs Intravenous Stopped 02/25/21 1327)  potassium chloride SA (KLOR-CON) CR tablet 40 mEq (40  mEq Oral Given 02/25/21 1324)    ED Course  I have reviewed the triage vital signs and the nursing notes.  Pertinent labs & imaging results that were available during my care of the patient were reviewed by me and considered in my medical decision making (see chart for details).    MDM Rules/Calculators/A&P                          25 y/o female presenting for eval of nv and abd cramping. She is currently pregnant  Reviewed/interpreted labs CBC with mild anemia, otherwise baseline CMP with mild hyponatremia, mild hypokalemia, otherwise reassuring Lipase neg Beta quant + UA appears contaminated but pt does have some leukocytes, 11-20 wbc, many bacteria so we will tx with keflex. Urine culture sent.  Wet prep with wbc, no evidence of  Gc/chlam pending upon discharge  Pelvic US - Single live intrauterine gestation at 6 weeks 2 days EGA by crown-rump length. No acute abnormalities.  On reassessment, pt is feeling improved. She is tolerating po. Will tx her possible uti with keflex. Will give rx for antiemetics. Have advised obgyn f/u and strict return precautions. She voices understanding of the plan and reasons to return. All questions answered, pt stable for discharge.  Final Clinical Impression(s) / ED Diagnoses Final diagnoses:  Nausea and vomiting, intractability of vomiting not specified, unspecified vomiting type  Urinary tract infection without hematuria, site unspecified    Rx / DC Orders ED Discharge Orders          Ordered    Doxylamine-Pyridoxine 10-10 MG TBEC        02/25/21 1711    cephALEXin (KEFLEX) 500 MG capsule  4 times daily        02/25/21 1712             Densel Kronick S, PA-C 02/25/21 1713  Tegeler, Canary Brim, MD 02/25/21 [phone]

## 2021-02-25 NOTE — ED Triage Notes (Signed)
States she is [redacted] weeks pregnant. Here for nausea and vomiting. Chills last night and this am.  Abdominal cramps.

## 2021-02-25 NOTE — Discharge Instructions (Addendum)
You were given a prescription for antibiotics. Please take the antibiotic prescription fully.   Your covid test will be available tomorrow on your mychart. If positive, you will need to quarantine accordingly.  Please follow up with your primary doctor within the next 5-7 days.  If you do not have a primary care provider, information for a healthcare clinic has been provided for you to make arrangements for follow up care. Please return to the ER sooner if you have any new or worsening symptoms, or if you have any of the following symptoms:  Abdominal pain that does not go away.  You have a fever.  You keep throwing up (vomiting).  The pain is felt only in portions of the abdomen. Pain in the right side could possibly be appendicitis. In an adult, pain in the left lower portion of the abdomen could be colitis or diverticulitis.  You pass bloody or black tarry stools.  There is bright red blood in the stool.  The constipation stays for more than 4 days.  There is belly (abdominal) or rectal pain.  You do not seem to be getting better.  You have any questions or concerns.

## 2021-02-25 NOTE — ED Notes (Signed)
Ultrasound present in patient room.

## 2021-02-27 LAB — URINE CULTURE

## 2021-12-13 ENCOUNTER — Emergency Department (HOSPITAL_BASED_OUTPATIENT_CLINIC_OR_DEPARTMENT_OTHER)
Admission: EM | Admit: 2021-12-13 | Discharge: 2021-12-13 | Discharge status: Against Medical Advice | Payer: Medicaid Other

## 2021-12-13 ENCOUNTER — Other Ambulatory Visit: Payer: Self-pay

## 2021-12-13 NOTE — ED Notes (Signed)
Left before triage.  Called x3 and search surrounding areas.  Unknown reason. ?

## 2022-01-21 ENCOUNTER — Emergency Department (HOSPITAL_BASED_OUTPATIENT_CLINIC_OR_DEPARTMENT_OTHER)
Admission: EM | Admit: 2022-01-21 | Discharge: 2022-01-21 | Disposition: A | Discharge status: Home / Self Care | Payer: Medicaid Other | Attending: Emergency Medicine | Admitting: Emergency Medicine

## 2022-01-21 ENCOUNTER — Other Ambulatory Visit: Payer: Self-pay

## 2022-01-21 ENCOUNTER — Encounter (HOSPITAL_BASED_OUTPATIENT_CLINIC_OR_DEPARTMENT_OTHER): Payer: Self-pay

## 2022-01-21 ENCOUNTER — Emergency Department (HOSPITAL_BASED_OUTPATIENT_CLINIC_OR_DEPARTMENT_OTHER): Payer: Medicaid Other

## 2022-01-21 DIAGNOSIS — R0789 Other chest pain: Secondary | ICD-10-CM | POA: Diagnosis present

## 2022-01-21 DIAGNOSIS — R000 Tachycardia, unspecified: Secondary | ICD-10-CM | POA: Insufficient documentation

## 2022-01-21 DIAGNOSIS — R791 Abnormal coagulation profile: Secondary | ICD-10-CM | POA: Diagnosis not present

## 2022-01-21 DIAGNOSIS — R079 Chest pain, unspecified: Secondary | ICD-10-CM

## 2022-01-21 HISTORY — DX: Nonrheumatic aortic (valve) stenosis: I35.0

## 2022-01-21 LAB — CBC
HCT: 32.3 % — ABNORMAL LOW (ref 36.0–46.0)
Hemoglobin: 10.7 g/dL — ABNORMAL LOW (ref 12.0–15.0)
MCH: 26.8 pg (ref 26.0–34.0)
MCHC: 33.1 g/dL (ref 30.0–36.0)
MCV: 80.8 fL (ref 80.0–100.0)
Platelets: 271 10*3/uL (ref 150–400)
RBC: 4 MIL/uL (ref 3.87–5.11)
RDW: 12.7 % (ref 11.5–15.5)
WBC: 13.9 10*3/uL — ABNORMAL HIGH (ref 4.0–10.5)
nRBC: 0 % (ref 0.0–0.2)

## 2022-01-21 LAB — BASIC METABOLIC PANEL
Anion gap: 4 — ABNORMAL LOW (ref 5–15)
BUN: 8 mg/dL (ref 6–20)
CO2: 26 mmol/L (ref 22–32)
Calcium: 8.7 mg/dL — ABNORMAL LOW (ref 8.9–10.3)
Chloride: 105 mmol/L (ref 98–111)
Creatinine, Ser: 0.71 mg/dL (ref 0.44–1.00)
GFR, Estimated: >60 mL/min (ref >60)
Glucose, Bld: 91 mg/dL (ref 70–99)
Potassium: 3.6 mmol/L (ref 3.5–5.1)
Sodium: 135 mmol/L (ref 135–145)

## 2022-01-21 LAB — PREGNANCY, URINE: Preg Test, Ur: NEGATIVE

## 2022-01-21 LAB — D-DIMER, QUANTITATIVE: D-Dimer, Quant: <0.27 ug/mL-FEU (ref 0.00–0.50)

## 2022-01-21 LAB — TROPONIN I (HIGH SENSITIVITY): Troponin I (High Sensitivity): 2 ng/L (ref <18)

## 2022-01-21 MED ORDER — KETOROLAC TROMETHAMINE 30 MG/ML IJ SOLN
30.0000 mg | Freq: Once | INTRAMUSCULAR | Status: AC
  Administered 2022-01-21: 30 mg via INTRAVENOUS
  Filled 2022-01-21: qty 1

## 2022-01-21 NOTE — ED Triage Notes (Signed)
Pt c/o L sided chest pain with radiation into L arm that started last PM. Pt states the pain hurts with inspiration. Pt has hx of aortic stenosis and and is scheduled for an echocardiogram next week.

## 2022-01-21 NOTE — Discharge Instructions (Signed)
Follow-up with your primary next week.  Also follow-up with cardiology for echocardiogram as planned.  You can take Tylenol or Motrin for pain.

## 2022-01-21 NOTE — ED Triage Notes (Signed)
EKG delayed due to pt talking and texting on phone.

## 2022-01-21 NOTE — ED Provider Notes (Signed)
[facility] EMERGENCY DEPARTMENT Provider Note   CSN: [id number] Arrival date & time: 01/21/22  1456     History  Chief Complaint  Patient presents with   Chest Pain    Breanna Clark is a 26 y.o. female.   Chest Pain  Patient with medical history of aortic stenosis presents today with left-sided chest pain.  It is pleuritic, worse with inspiration.  No associated nausea or vomiting or shortness of breath.  It is well localized, does not radiate elsewhere.  Not positional.  Patient is on oral birth control, no recent surgery or travel.  No history of PE or MI.  She has a known history of aortic stenosis and has echocardiogram patch scheduled for next week, denies any syncope or presyncopal episodes.  Home Medications Prior to Admission medications   Medication Sig Start Date End Date Taking? Authorizing Provider  Doxylamine-Pyridoxine 10-10 MG TBEC Take two tablets by mouth at bedtime on day 1 and 2;  If symptoms persist, take 1 tablet in morning and 2 tablets at bedtime on day 3;  If symptoms persist, may increase to 1 tablet in morning, 1 tablet mid-afternoon, and 2 tablets at bedtime on day 4 (maximum: doxylamine 40 mg/pyridoxine 40 mg (4 tablets) per day). 02/25/21  Yes Couture, Cortni S, PA-C  EPINEPHrine 0.3 mg/0.3 mL IJ SOAJ injection Inject 0.3 mLs (0.3 mg total) into the muscle as needed for anaphylaxis. 06/26/19  Yes Raeford RazorKohut, Stephen, MD  ondansetron (ZOFRAN ODT) 4 MG disintegrating tablet Take 1 tablet (4 mg total) by mouth every 8 (eight) hours as needed for nausea or vomiting. 03/19/20  Yes Pollyann SavoySheldon, Charles B, MD  promethazine (PHENERGAN) 25 MG tablet Take 1 tablet (25 mg total) by mouth every 8 (eight) hours as needed for nausea. 03/23/20  Yes Benjiman CorePickering, Nathan, MD      Allergies    Dust mite extract, Grass extracts [gramineae pollens], and Shrimp [shellfish allergy]    Review of Systems   Review of Systems  Cardiovascular:  Positive for chest pain.    Physical Exam Updated Vital Signs BP 114/75   Pulse 93   Temp 98.5 F (36.9 C) (Oral)   Resp 20   Ht 5' (1.524 m)   Wt 69.4 kg   LMP 01/19/2022   SpO2 100%   Breastfeeding No   BMI 29.88 kg/m  Physical Exam Vitals and nursing note reviewed. Exam conducted with a chaperone present.  Constitutional:      Appearance: Normal appearance.  HENT:     Head: Normocephalic and atraumatic.  Eyes:     General: No scleral icterus.       Right eye: No discharge.        Left eye: No discharge.     Extraocular Movements: Extraocular movements intact.     Pupils: Pupils are equal, round, and reactive to light.  Cardiovascular:     Rate and Rhythm: Normal rate and regular rhythm.     Pulses: Normal pulses.     Heart sounds: No murmur heard.   No friction rub. No gallop.  Pulmonary:     Effort: Pulmonary effort is normal. No respiratory distress.     Breath sounds: Normal breath sounds.  Abdominal:     General: Abdomen is flat. Bowel sounds are normal. There is no distension.     Palpations: Abdomen is soft.     Tenderness: There is no abdominal tenderness.  Skin:    General: Skin is warm and dry.  Coloration: Skin is not jaundiced.  Neurological:     Mental Status: She is alert. Mental status is at baseline.     Coordination: Coordination normal.    ED Results / Procedures / Treatments   Labs (all labs ordered are listed, but only abnormal results are displayed) Labs Reviewed  BASIC METABOLIC PANEL - Abnormal; Notable for the following components:      Result Value   Calcium 8.7 (*)    Anion gap 4 (*)    All other components within normal limits  CBC - Abnormal; Notable for the following components:   WBC 13.9 (*)    Hemoglobin 10.7 (*)    HCT 32.3 (*)    All other components within normal limits  PREGNANCY, URINE  D-DIMER, QUANTITATIVE  TROPONIN I (HIGH SENSITIVITY)  TROPONIN I (HIGH SENSITIVITY)    EKG None  Radiology DG Chest 2 View  Result Date:  01/21/2022 CLINICAL DATA:  Chest pain. EXAM: CHEST - 2 VIEW COMPARISON:  September 19, 2018. FINDINGS: The heart size and mediastinal contours are within normal limits. Both lungs are clear. The visualized skeletal structures are unremarkable. IMPRESSION: No active cardiopulmonary disease. Electronically Signed   By: Lupita Raider M.D.   On: 01/21/2022 15:33    Procedures Procedures    Medications Ordered in ED Medications  ketorolac (TORADOL) 30 MG/ML injection 30 mg (has no administration in time range)    ED Course/ Medical Decision Making/ A&P                           Medical Decision Making Amount and/or Complexity of Data Reviewed Labs: ordered. Radiology: ordered.   Patient presents due to chest pain.  Differential diagnosis includes not limited to ACS, PE, symptomatic aortic stenosis, pneumonia, pericarditis, GERD, esophageal rapture.  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   Additional history obtained:   Independent historian: mom  I reviewed records from [facility], patient had a negative right lower extremity duplex ultrasound at that time.  She also recently had a visit with cardiology outpatient (01/19/22).  Patient has an upcoming echocardiogram ordered.  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    Lab Tests:  I ordered, viewed, and personally interpreted labs.  The pertinent results include: Patient is slightly anemic at 10.7.  Slight leukocytosis at 13.9.  No gross electrolyte derangement or AKI.  Troponin is negative.  Dimer is negative.  Patient is not pregnant.    Imaging Studies ordered:  I directly visualized the CXR, which showed no acute process.  Specifically no underlying pneumonia, pneumothorax, widened mediastinum, cardiomegaly.  I agree with the radiologist interpretation    ECG/Cardiac monitoring:   Per my interpretation, EKG shows sinus  tachycardia  The patient was maintained on a cardiac monitor.  Visualized monitor strip which showed sinus rhythm with a heart rate in 93 per my interpretation.    Medicines ordered and prescription drug management:  I ordered medication including: Toradol  I have reviewed the patients home medicines and have made adjustments as needed   Test Considered:  Patient heart score 0.  Given negative troponin and no ischemic findings on EKG doubt ACS.  Patient cannot be perked out given tachycardia, given dimer is low have a low suspicion for PE.    Reevaluation:  After the interventions noted above, I reevaluated the patient and found patient is resting comfortably, no  current pain.   Problems addressed / ED Course: Chest pain-doubt PE or ACS.  Presentation is not consistent with pericarditis given lack of positional relation, no symptomatic aortic stenosis although she does have known aortic stenosis.  No hypoxia, vitals are stable.  Do not feel patient needs any additional work-up at this time, encouraged her to continue following up with cardiology and have the echocardiogram scheduled.  Patient discharged in stable condition.   Social Determinants of Health:    Disposition:   After consideration of the diagnostic results and the patients response to treatment, I feel that the patent would benefit from cards F/U.          Final Clinical Impression(s) / ED Diagnoses Final diagnoses:  None    Rx / DC Orders ED Discharge Orders     None         Theron Arista, PA-C 01/21/22 1643    Melene Plan, DO 01/24/22 0700

## 2022-03-18 IMAGING — US US OB < 14 WEEKS - US OB TV
1 series · 14 of 28 positions shown · non-contrast
Comparison: None

CLINICAL DATA: Pelvic pain during first trimester of pregnancy, LMP
01/19/2021, quantitative beta HCG 55,367

EXAM:
OBSTETRIC <14 WK US AND TRANSVAGINAL OB US
TECHNIQUE: Both transabdominal and transvaginal ultrasound examinations were
performed for complete evaluation of the gestation as well as the
maternal uterus, adnexal regions, and pelvic cul-de-sac.
Transvaginal technique was performed to assess early pregnancy.

[Series 1: us ob < 14 weeks - us ob tv · 57 acquisitions, 14 frames shown]
[im 3/57]
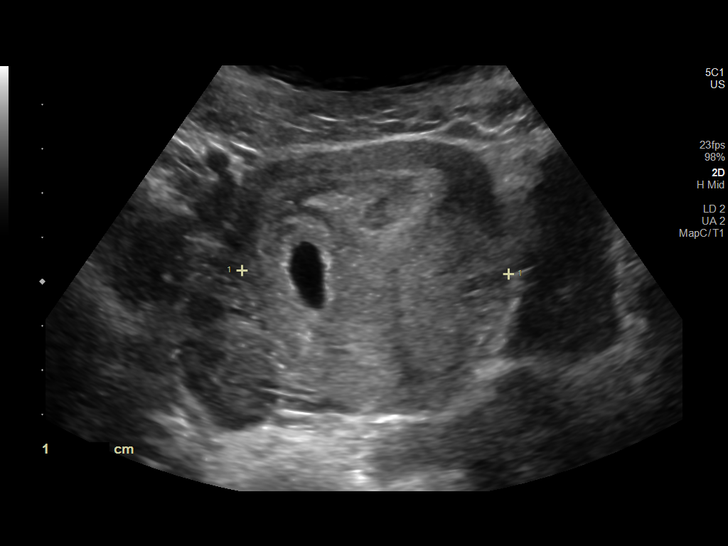
[im 7/57]
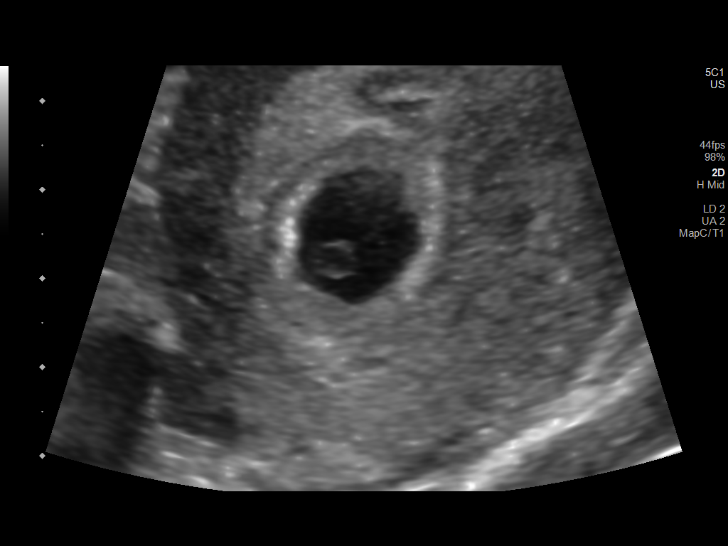
[im 11/57]
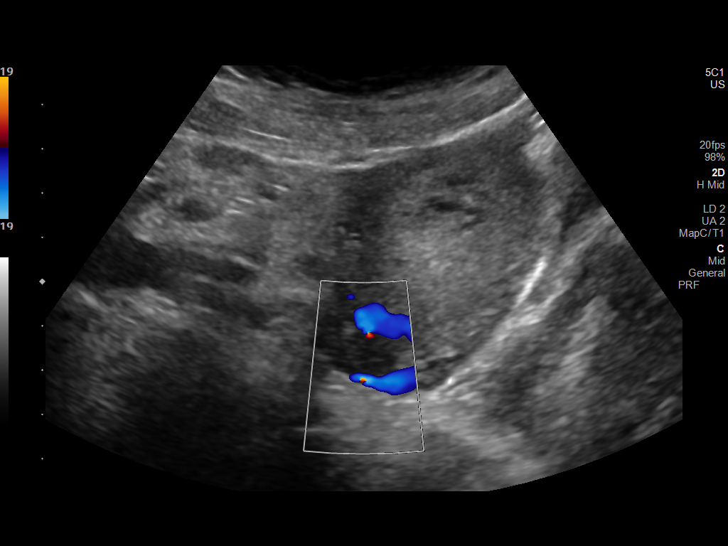
[im 15/57]
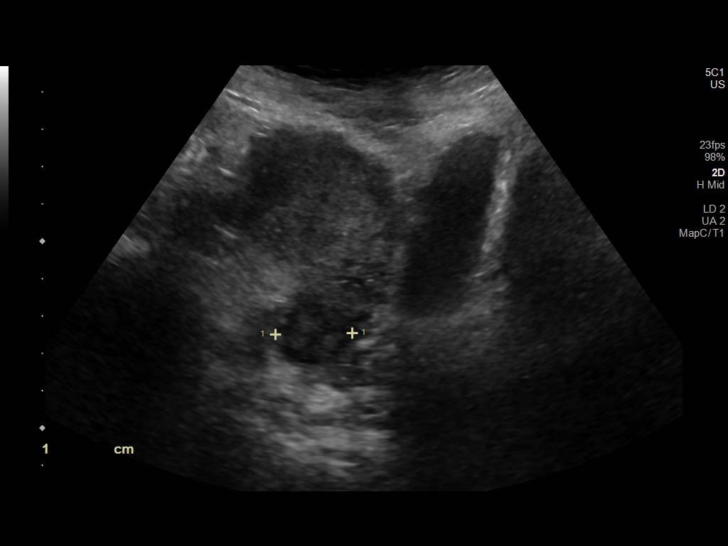
[im 19/57]
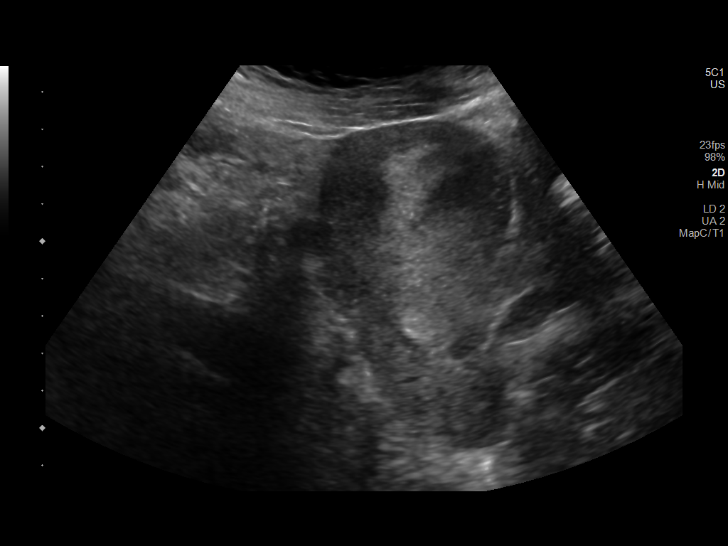
[im 23/57]
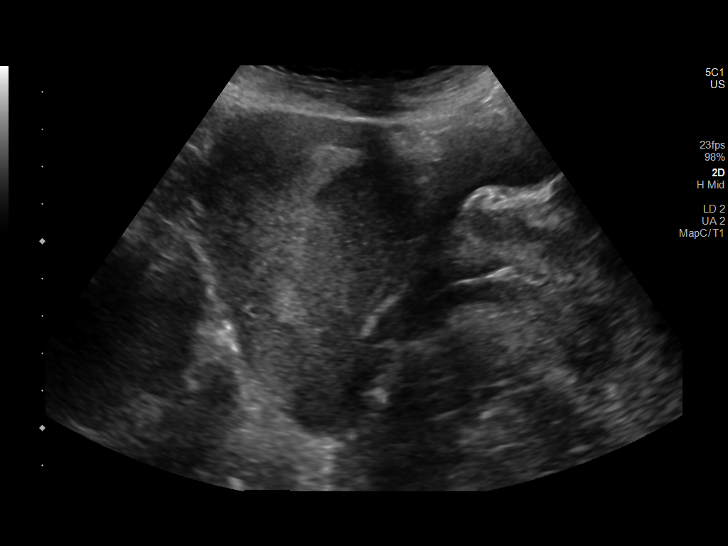
[im 27/57]
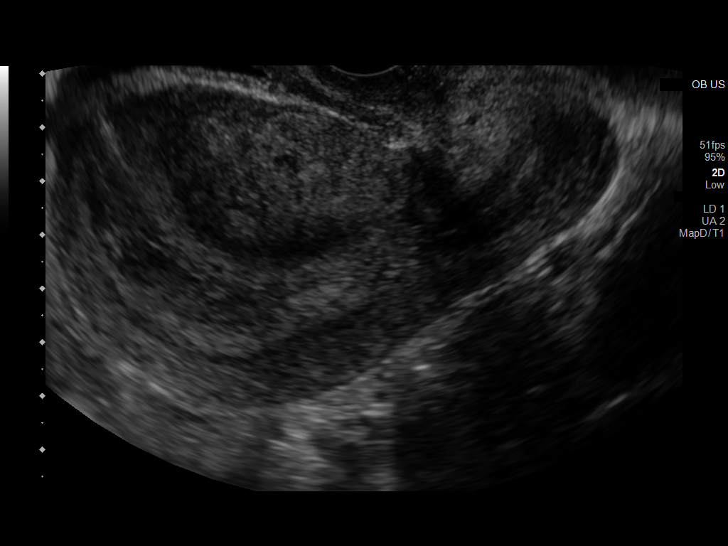
[im 32/57]
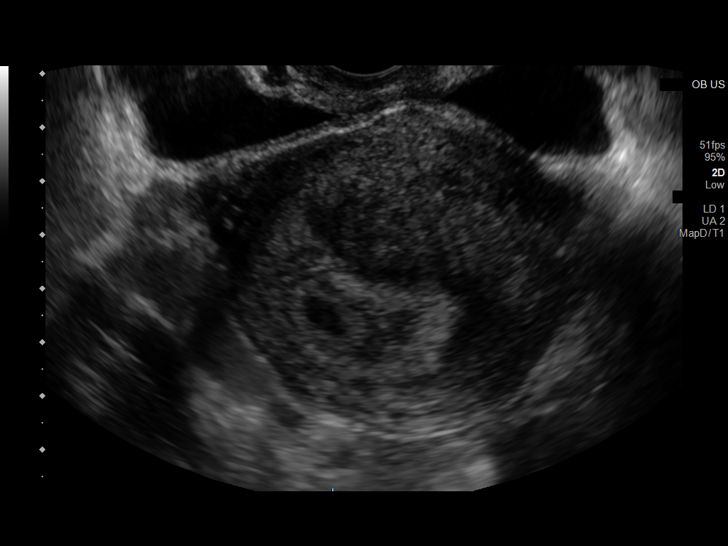
[im 36/57]
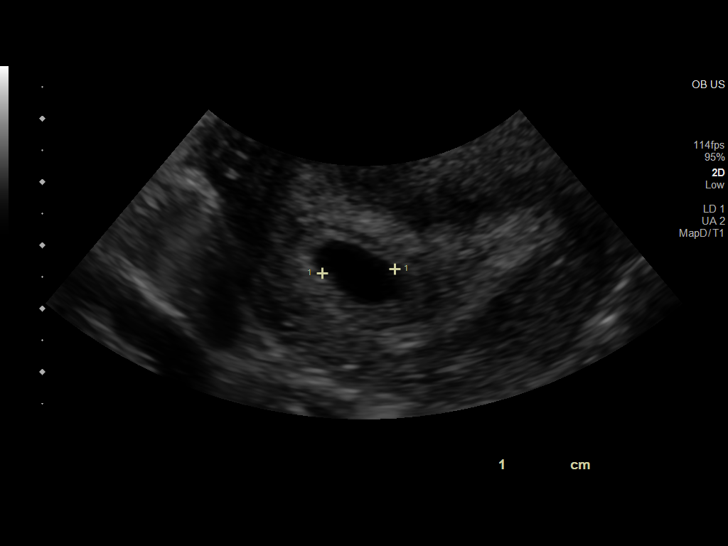
[im 40/57]
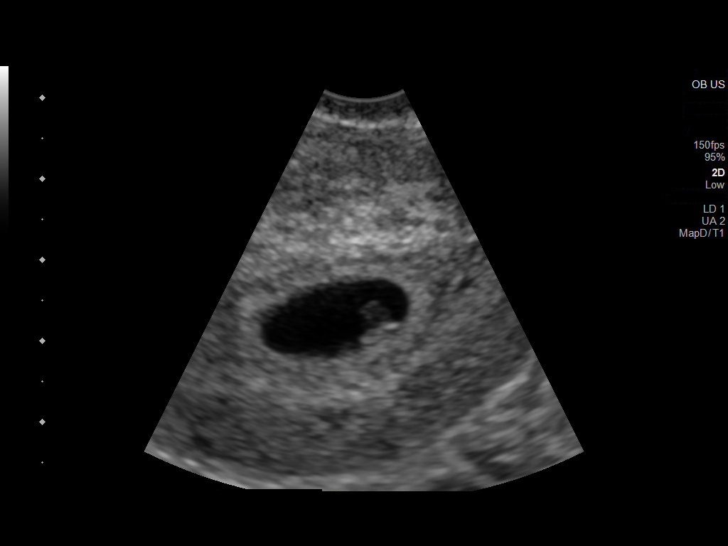
[im 44/57]
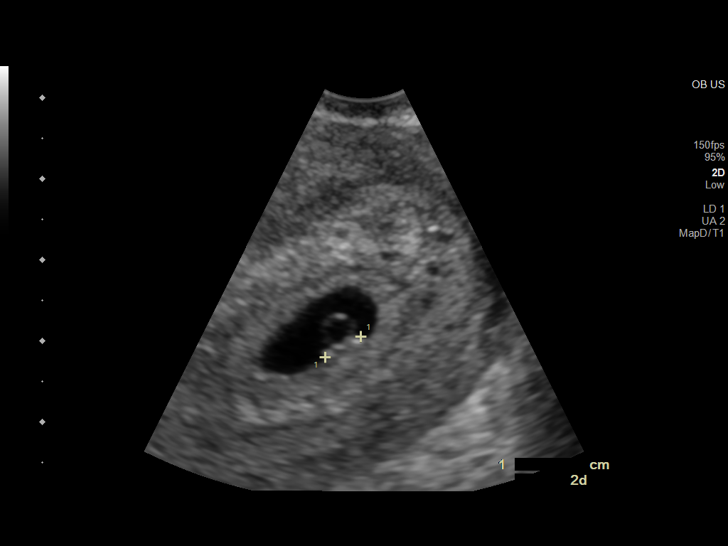
[im 48/57]
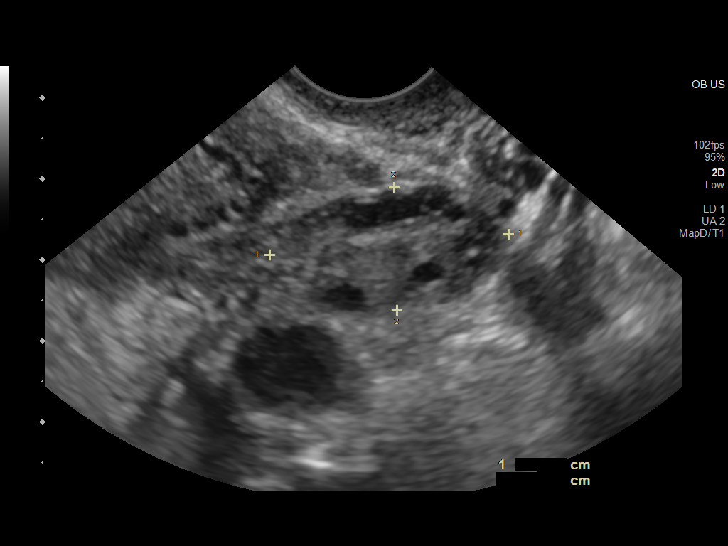
[im 52/57]
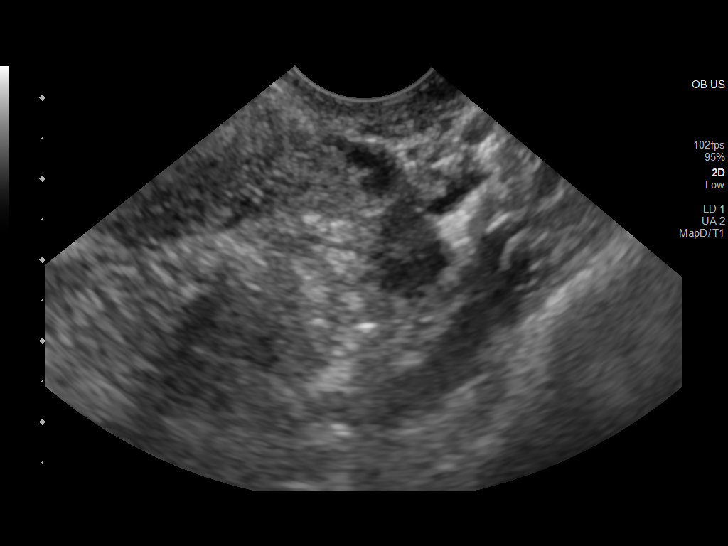
[im 57/57]
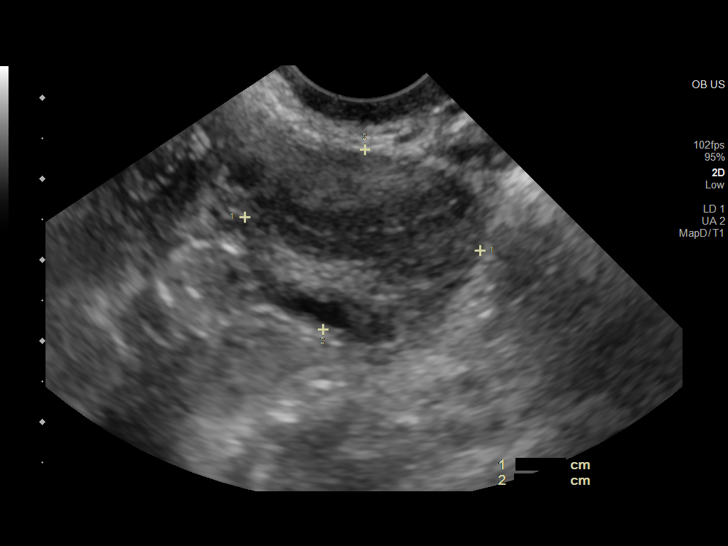

[14 of 28 positions shown; findings below may reference images not displayed]

FINDINGS: Intrauterine gestational sac: Present, single

Yolk sac:  Present

Embryo:  Present

Cardiac Activity: Present

Heart Rate: 118 bpm

CRL:  5.1 mm   6 w   2 d                  US EDC: 10/18/2020

Subchorionic hemorrhage:  None visualized.

Maternal uterus/adnexae:

Uterus anteverted, otherwise unremarkable.

LEFT ovary normal size and morphology, 3.0 x 1.5 x 1.7 cm.

RIGHT ovary normal size and morphology, 3.9 x 2.9 x 2.3 cm.

No free pelvic fluid or adnexal masses.
IMPRESSION: Single live intrauterine gestation at 6 weeks 2 days EGA by
crown-rump length.

No acute abnormalities.

## 2022-07-24 ENCOUNTER — Other Ambulatory Visit: Payer: Self-pay

## 2022-07-24 ENCOUNTER — Encounter (HOSPITAL_BASED_OUTPATIENT_CLINIC_OR_DEPARTMENT_OTHER): Payer: Self-pay | Admitting: Emergency Medicine

## 2022-07-24 ENCOUNTER — Emergency Department (HOSPITAL_BASED_OUTPATIENT_CLINIC_OR_DEPARTMENT_OTHER)
Admission: EM | Admit: 2022-07-24 | Discharge: 2022-07-24 | Disposition: A | Discharge status: Home / Self Care | Payer: Medicaid Other | Attending: Emergency Medicine | Admitting: Emergency Medicine

## 2022-07-24 DIAGNOSIS — B349 Viral infection, unspecified: Secondary | ICD-10-CM | POA: Insufficient documentation

## 2022-07-24 DIAGNOSIS — Z1152 Encounter for screening for COVID-19: Secondary | ICD-10-CM | POA: Diagnosis not present

## 2022-07-24 DIAGNOSIS — R519 Headache, unspecified: Secondary | ICD-10-CM | POA: Diagnosis present

## 2022-07-24 LAB — RESP PANEL BY RT-PCR (FLU A&B, COVID) ARPGX2
Influenza A by PCR: NEGATIVE
Influenza B by PCR: NEGATIVE
SARS Coronavirus 2 by RT PCR: NEGATIVE

## 2022-07-24 MED ORDER — KETOROLAC TROMETHAMINE 60 MG/2ML IM SOLN
60.0000 mg | Freq: Once | INTRAMUSCULAR | Status: AC
  Administered 2022-07-24: 60 mg via INTRAMUSCULAR
  Filled 2022-07-24: qty 2

## 2022-07-24 MED ORDER — PROMETHAZINE HCL 25 MG PO TABS
25.0000 mg | ORAL_TABLET | Freq: Once | ORAL | Status: AC
  Administered 2022-07-24: 25 mg via ORAL
  Filled 2022-07-24: qty 1

## 2022-07-24 MED ORDER — PROMETHAZINE HCL 25 MG PO TABS: 25.0000 mg | ORAL_TABLET | Freq: Four times a day (QID) | ORAL | 0 refills | Status: AC | PRN

## 2022-07-24 NOTE — ED Triage Notes (Signed)
Pt reports HA, chills, general malaise; daughter recently had a "stomach bug"

## 2022-07-24 NOTE — ED Provider Notes (Signed)
[facility] EMERGENCY DEPARTMENT Provider Note   CSN: [id number] Arrival date & time: 07/24/22  1555     History  Chief Complaint  Patient presents with   Headache    Breanna Clark is a 26 y.o. female.  Patient here with chills, headache, had nausea vomiting diarrhea earlier.  Daughter recently sick with the same thing.  Mostly having some fever and chills and headaches at time.  Denies any current nausea or vomiting.  Denies any current fever.  No abdominal pain, no vision loss or weakness or numbness.  No major medical problems.  States she had a menstrual cycle recently.  Not worried about pregnancy.  The history is provided by the patient.       Home Medications Prior to Admission medications   Medication Sig Start Date End Date Taking? Authorizing Provider  promethazine (PHENERGAN) 25 MG tablet Take 1 tablet (25 mg total) by mouth every 6 (six) hours as needed for up to 20 doses for nausea or vomiting. 07/24/22  Yes Candace Ramus, DO  Doxylamine-Pyridoxine 10-10 MG TBEC Take two tablets by mouth at bedtime on day 1 and 2;  If symptoms persist, take 1 tablet in morning and 2 tablets at bedtime on day 3;  If symptoms persist, may increase to 1 tablet in morning, 1 tablet mid-afternoon, and 2 tablets at bedtime on day 4 (maximum: doxylamine 40 mg/pyridoxine 40 mg (4 tablets) per day). 02/25/21   Couture, Cortni S, PA-C  EPINEPHrine 0.3 mg/0.3 mL IJ SOAJ injection Inject 0.3 mLs (0.3 mg total) into the muscle as needed for anaphylaxis. 06/26/19   Raeford Razor, MD  ondansetron (ZOFRAN ODT) 4 MG disintegrating tablet Take 1 tablet (4 mg total) by mouth every 8 (eight) hours as needed for nausea or vomiting. 03/19/20   Pollyann Savoy, MD      Allergies    Dust mite extract, Grass extracts [gramineae pollens], and Shrimp [shellfish allergy]    Review of Systems   Review of Systems  Physical Exam Updated Vital Signs BP (!) 119/90 (BP Location: Left Arm)    Pulse 94   Temp 98.6 F (37 C) (Oral)   Resp 17   Ht 5' (1.524 m)   Wt 68 kg   LMP 07/06/2022   SpO2 100%   BMI 29.29 kg/m  Physical Exam Vitals and nursing note reviewed.  Constitutional:      General: She is not in acute distress.    Appearance: She is well-developed. She is not ill-appearing.  HENT:     Head: Normocephalic and atraumatic.  Eyes:     General: No visual field deficit.    Extraocular Movements: Extraocular movements intact.     Conjunctiva/sclera: Conjunctivae normal.     Pupils: Pupils are equal, round, and reactive to light.  Cardiovascular:     Rate and Rhythm: Normal rate and regular rhythm.     Heart sounds: Normal heart sounds. No murmur heard. Pulmonary:     Effort: Pulmonary effort is normal. No respiratory distress.     Breath sounds: Normal breath sounds.  Abdominal:     Palpations: Abdomen is soft.     Tenderness: There is no abdominal tenderness.  Musculoskeletal:        General: No swelling.     Cervical back: Normal range of motion and neck supple.  Skin:    General: Skin is warm and dry.     Capillary Refill: Capillary refill takes less than 2 seconds.  Neurological:  Mental Status: She is alert and oriented to person, place, and time.     Cranial Nerves: No cranial nerve deficit, dysarthria or facial asymmetry.     Sensory: No sensory deficit.     Motor: No weakness.     Coordination: Coordination normal.  Psychiatric:        Mood and Affect: Mood normal.     ED Results / Procedures / Treatments   Labs (all labs ordered are listed, but only abnormal results are displayed) Labs Reviewed  RESP PANEL BY RT-PCR (FLU A&B, COVID) [id number]    EKG None  Radiology No results found.  Procedures Procedures    Medications Ordered in ED Medications  ketorolac (TORADOL) injection 60 mg (60 mg Intramuscular Given 07/24/22 1659)  promethazine (PHENERGAN) tablet 25 mg (25 mg Oral Given 07/24/22 1658)    ED Course/ Medical  Decision Making/ A&P                           Medical Decision Making Amount and/or Complexity of Data Reviewed Labs: ordered.  Risk Prescription drug management.   Breanna Clark is here for headache and viral symptoms.  Normal vitals.  No fever.  Daughter with recent viral illness with nausea vomiting diarrhea.  Patient with the same.  We will test for COVID and flu.  Patient given Toradol and Phenergan for headache and nausea.  Overall she appears well.  Neurologically she is intact.  She has no abdominal pain.  Have no concern for other acute process including appendicitis, pneumonia, UTI.  Given reassurance and discharged in ED in good condition.  This chart was dictated using voice recognition software.  Despite best efforts to proofread,  errors can occur which can change the documentation meaning.         Final Clinical Impression(s) / ED Diagnoses Final diagnoses:  Viral illness    Rx / DC Orders ED Discharge Orders          Ordered    promethazine (PHENERGAN) 25 MG tablet  Every 6 hours PRN        07/24/22 1659              Caprice Mccaffrey, DO 07/24/22 1659

## 2023-02-11 IMAGING — CR DG CHEST 2V
2 series · 2 of 2 positions shown · non-contrast
Comparison: September 19, 2018.

CLINICAL DATA: Chest pain.

EXAM:
CHEST - 2 VIEW

[w chest pa]
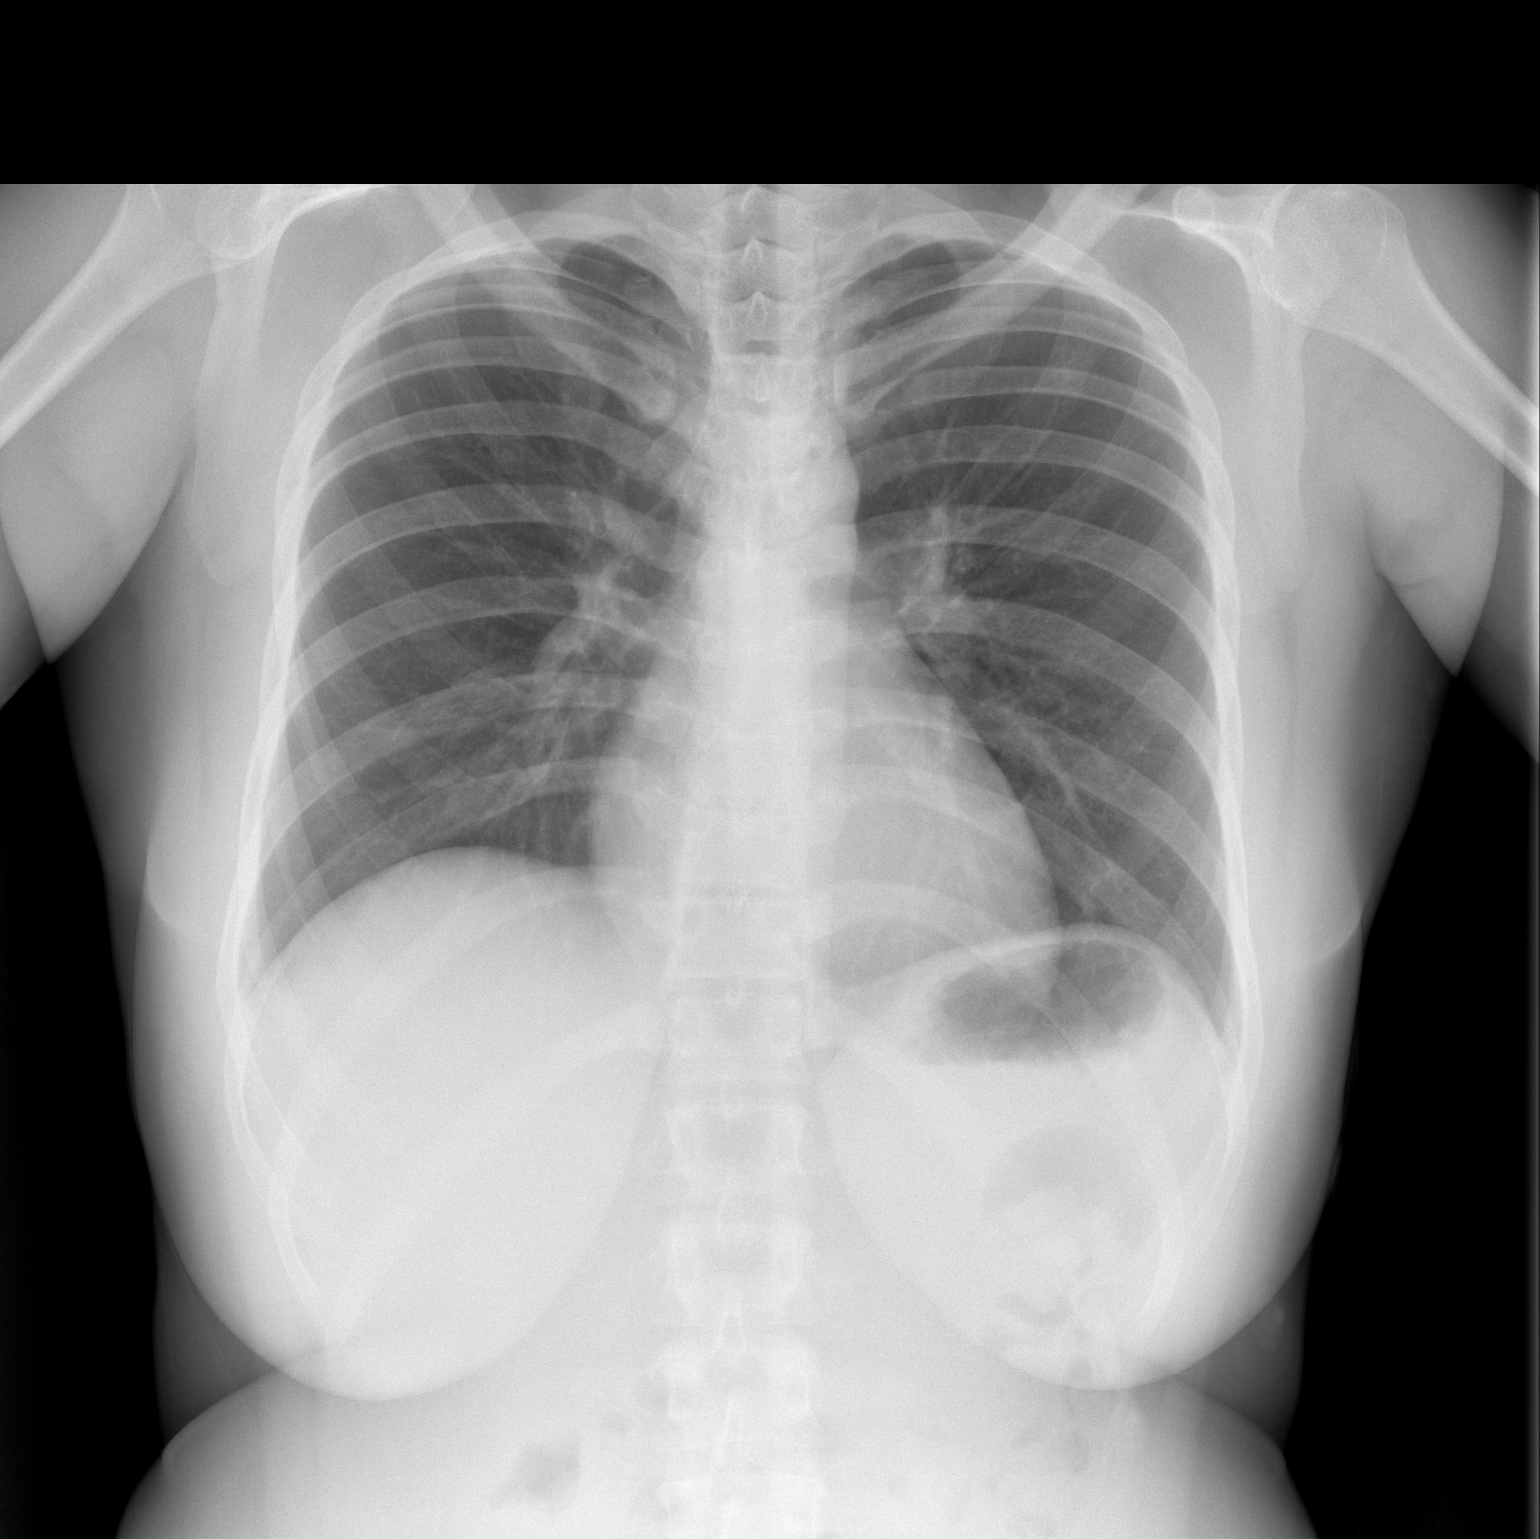

[w chest lat]
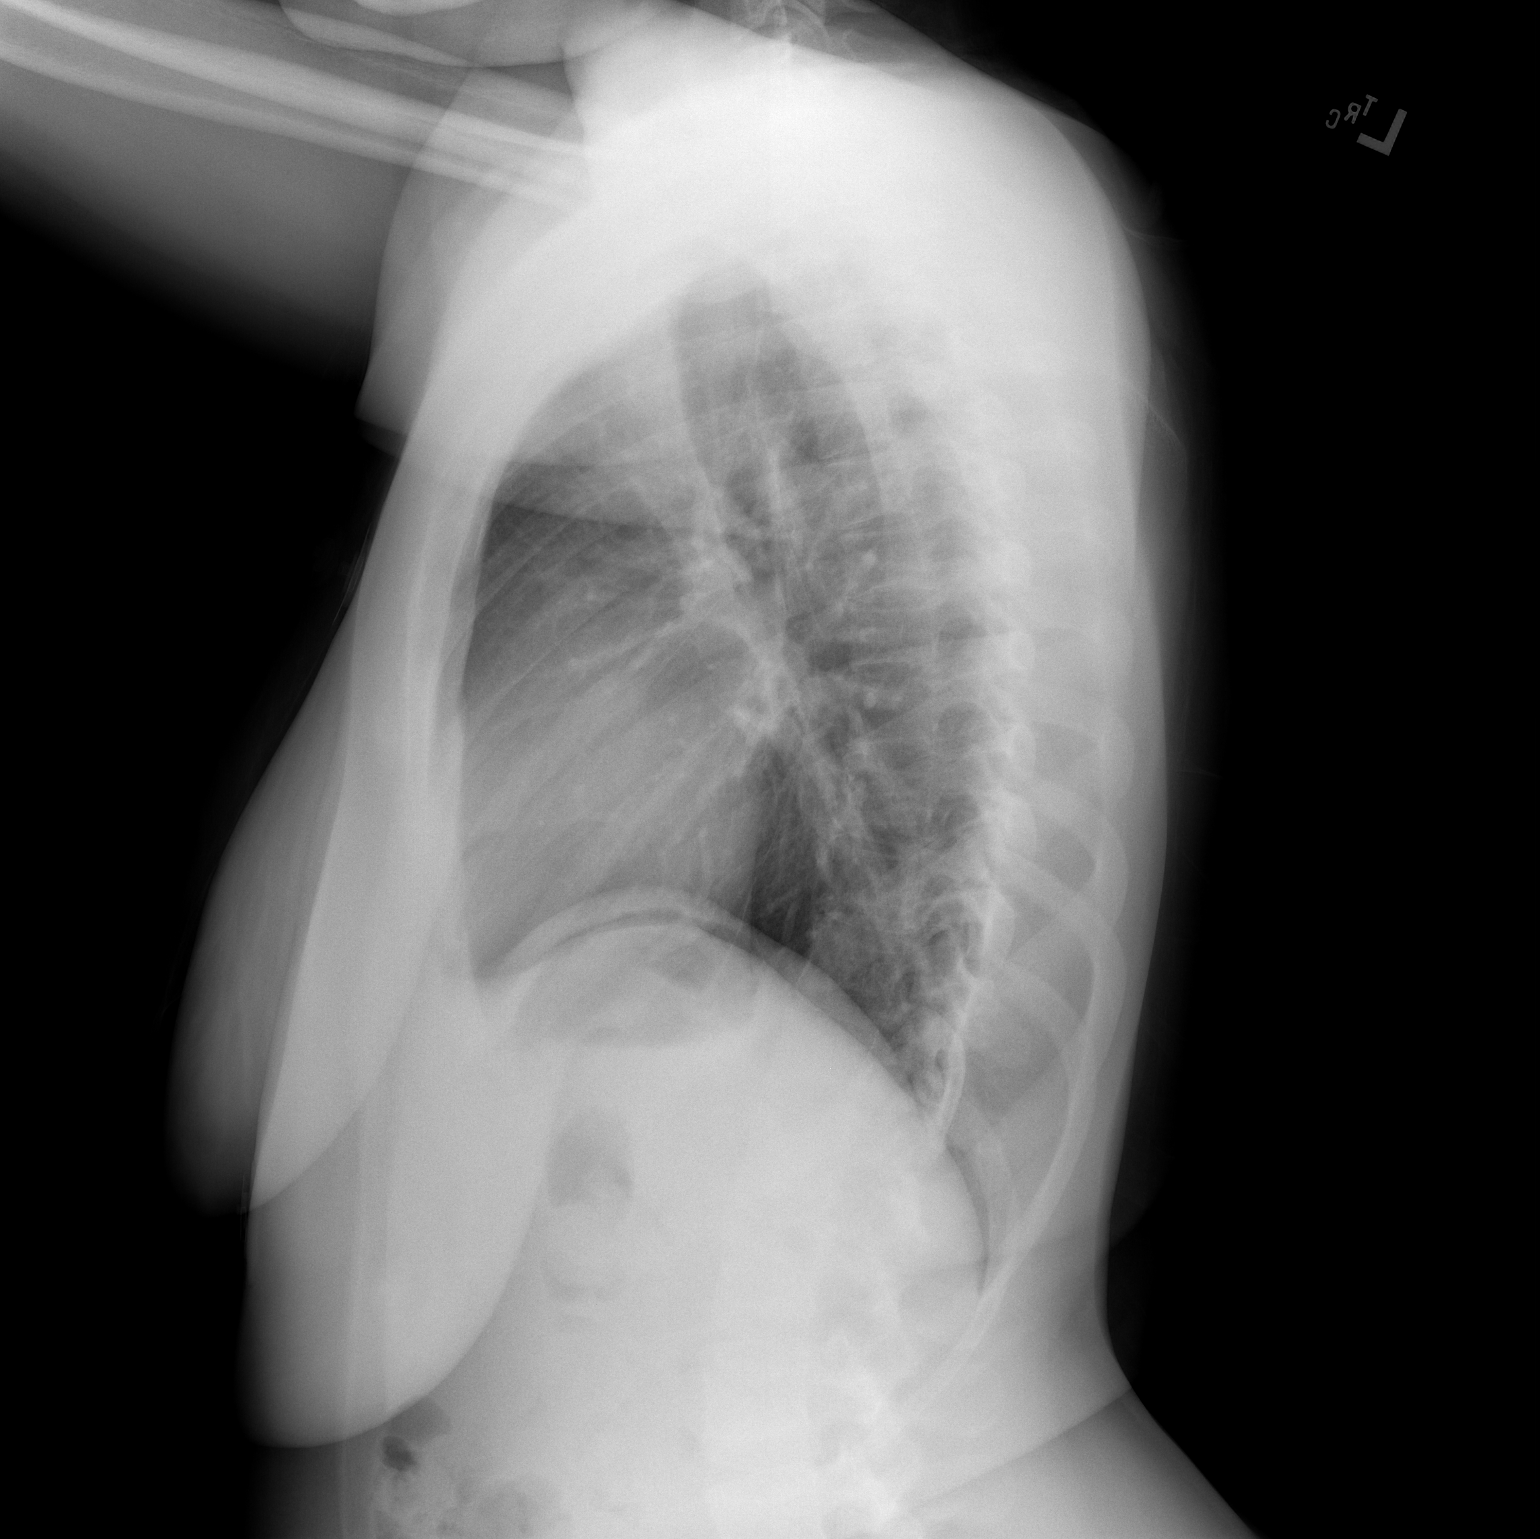

[2 of 2 positions shown; findings below may reference images not displayed]

FINDINGS: The heart size and mediastinal contours are within normal limits.
Both lungs are clear. The visualized skeletal structures are
unremarkable.
IMPRESSION: No active cardiopulmonary disease.

## 2023-06-13 ENCOUNTER — Emergency Department (HOSPITAL_BASED_OUTPATIENT_CLINIC_OR_DEPARTMENT_OTHER)
Admission: EM | Admit: 2023-06-13 | Discharge: 2023-06-13 | Discharge status: Against Medical Advice | Payer: Medicaid Other | Attending: Emergency Medicine | Admitting: Emergency Medicine

## 2023-06-13 ENCOUNTER — Other Ambulatory Visit: Payer: Self-pay

## 2023-06-13 ENCOUNTER — Encounter (HOSPITAL_BASED_OUTPATIENT_CLINIC_OR_DEPARTMENT_OTHER): Payer: Self-pay

## 2023-06-13 ENCOUNTER — Emergency Department (HOSPITAL_BASED_OUTPATIENT_CLINIC_OR_DEPARTMENT_OTHER): Payer: Medicaid Other

## 2023-06-13 DIAGNOSIS — Z5321 Procedure and treatment not carried out due to patient leaving prior to being seen by health care provider: Secondary | ICD-10-CM | POA: Diagnosis not present

## 2023-06-13 DIAGNOSIS — R0602 Shortness of breath: Secondary | ICD-10-CM | POA: Insufficient documentation

## 2023-06-13 DIAGNOSIS — R0789 Other chest pain: Secondary | ICD-10-CM | POA: Diagnosis present

## 2023-06-13 LAB — CBC
HCT: 26.3 % — ABNORMAL LOW (ref 36.0–46.0)
Hemoglobin: 8.2 g/dL — ABNORMAL LOW (ref 12.0–15.0)
MCH: 22.2 pg — ABNORMAL LOW (ref 26.0–34.0)
MCHC: 31.2 g/dL (ref 30.0–36.0)
MCV: 71.3 fL — ABNORMAL LOW (ref 80.0–100.0)
Platelets: 313 10*3/uL (ref 150–400)
RBC: 3.69 MIL/uL — ABNORMAL LOW (ref 3.87–5.11)
RDW: 17.3 % — ABNORMAL HIGH (ref 11.5–15.5)
WBC: 4.4 10*3/uL (ref 4.0–10.5)
nRBC: 0 % (ref 0.0–0.2)

## 2023-06-13 LAB — BASIC METABOLIC PANEL
Anion gap: 7 (ref 5–15)
BUN: 12 mg/dL (ref 6–20)
CO2: 24 mmol/L (ref 22–32)
Calcium: 8.4 mg/dL — ABNORMAL LOW (ref 8.9–10.3)
Chloride: 107 mmol/L (ref 98–111)
Creatinine, Ser: 0.7 mg/dL (ref 0.44–1.00)
GFR, Estimated: >60 mL/min (ref >60)
Glucose, Bld: 111 mg/dL — ABNORMAL HIGH (ref 70–99)
Potassium: 3.7 mmol/L (ref 3.5–5.1)
Sodium: 138 mmol/L (ref 135–145)

## 2023-06-13 LAB — HCG, SERUM, QUALITATIVE: Preg, Serum: NEGATIVE

## 2023-06-13 LAB — TROPONIN I (HIGH SENSITIVITY): Troponin I (High Sensitivity): <2 ng/L (ref <18)

## 2023-06-13 NOTE — ED Triage Notes (Signed)
Patient reports left sided chest pain that started 1 hour ago. Rates pain 7/10. States it radiates to her left shoulder. Patient has history of aortic stenosis and sees a cardiologist to manage it. No blood thinners. Patient also reports intermittent shortness of breath.
